# Patient Record
Sex: Female | Born: 1955 | Race: Black or African American | Hispanic: No | Marital: Married | Smoking: Former smoker
Health system: Southern US, Community
[De-identification: ages and names within clinical notes are randomized; demographics above are authoritative.]

## PROBLEM LIST (undated history)

## (undated) DIAGNOSIS — I10 Essential (primary) hypertension: Secondary | ICD-10-CM

---

## 1997-11-20 ENCOUNTER — Inpatient Hospital Stay (HOSPITAL_COMMUNITY): Admission: EM | Admit: 1997-11-20 | Discharge: 1997-11-23 | Payer: Self-pay | Admitting: Emergency Medicine

## 1997-11-25 ENCOUNTER — Inpatient Hospital Stay (HOSPITAL_COMMUNITY): Admission: EM | Admit: 1997-11-25 | Discharge: 1997-11-30 | Payer: Self-pay | Admitting: Emergency Medicine

## 1998-03-01 ENCOUNTER — Ambulatory Visit (HOSPITAL_COMMUNITY): Admission: RE | Admit: 1998-03-01 | Discharge: 1998-03-01 | Payer: Self-pay | Admitting: *Deleted

## 1998-09-23 ENCOUNTER — Inpatient Hospital Stay (HOSPITAL_COMMUNITY): Admission: AD | Admit: 1998-09-23 | Discharge: 1998-09-23 | Payer: Self-pay | Admitting: Obstetrics & Gynecology

## 1999-01-15 ENCOUNTER — Emergency Department (HOSPITAL_COMMUNITY): Admission: EM | Admit: 1999-01-15 | Discharge: 1999-01-15 | Payer: Self-pay | Admitting: *Deleted

## 1999-10-01 ENCOUNTER — Other Ambulatory Visit: Admission: RE | Admit: 1999-10-01 | Discharge: 1999-10-01 | Payer: Self-pay | Admitting: Obstetrics and Gynecology

## 1999-11-14 ENCOUNTER — Observation Stay (HOSPITAL_COMMUNITY): Admission: RE | Admit: 1999-11-14 | Discharge: 1999-11-15 | Payer: Self-pay | Admitting: Obstetrics and Gynecology

## 1999-11-16 ENCOUNTER — Inpatient Hospital Stay (HOSPITAL_COMMUNITY): Admission: AD | Admit: 1999-11-16 | Discharge: 1999-11-16 | Payer: Self-pay | Admitting: Obstetrics and Gynecology

## 1999-11-18 ENCOUNTER — Inpatient Hospital Stay (HOSPITAL_COMMUNITY): Admission: EM | Admit: 1999-11-18 | Discharge: 1999-11-18 | Payer: Self-pay | Admitting: Obstetrics and Gynecology

## 1999-11-21 ENCOUNTER — Inpatient Hospital Stay (HOSPITAL_COMMUNITY): Admission: AD | Admit: 1999-11-21 | Discharge: 1999-11-21 | Payer: Self-pay | Admitting: *Deleted

## 2000-04-25 ENCOUNTER — Inpatient Hospital Stay (HOSPITAL_COMMUNITY): Admission: EM | Admit: 2000-04-25 | Discharge: 2000-04-27 | Payer: Self-pay

## 2000-04-25 ENCOUNTER — Encounter: Payer: Self-pay | Admitting: Emergency Medicine

## 2002-04-15 ENCOUNTER — Encounter: Admission: RE | Admit: 2002-04-15 | Discharge: 2002-04-15 | Payer: Self-pay | Admitting: *Deleted

## 2002-08-16 ENCOUNTER — Encounter: Admission: RE | Admit: 2002-08-16 | Discharge: 2002-08-16 | Payer: Self-pay | Admitting: *Deleted

## 2002-08-16 ENCOUNTER — Encounter: Payer: Self-pay | Admitting: *Deleted

## 2003-02-17 ENCOUNTER — Ambulatory Visit (HOSPITAL_BASED_OUTPATIENT_CLINIC_OR_DEPARTMENT_OTHER): Admission: RE | Admit: 2003-02-17 | Discharge: 2003-02-17 | Payer: Self-pay | Admitting: Urology

## 2003-04-03 ENCOUNTER — Inpatient Hospital Stay (HOSPITAL_COMMUNITY): Admission: AD | Admit: 2003-04-03 | Discharge: 2003-04-03 | Payer: Self-pay | Admitting: Obstetrics

## 2003-04-11 ENCOUNTER — Encounter (INDEPENDENT_AMBULATORY_CARE_PROVIDER_SITE_OTHER): Payer: Self-pay

## 2003-04-12 ENCOUNTER — Ambulatory Visit (HOSPITAL_COMMUNITY): Admission: RE | Admit: 2003-04-12 | Discharge: 2003-04-12 | Payer: Self-pay | Admitting: Obstetrics

## 2003-08-10 ENCOUNTER — Ambulatory Visit (HOSPITAL_COMMUNITY): Admission: RE | Admit: 2003-08-10 | Discharge: 2003-08-10 | Payer: Self-pay | Admitting: Cardiology

## 2003-11-14 ENCOUNTER — Encounter: Admission: RE | Admit: 2003-11-14 | Discharge: 2003-11-14 | Payer: Self-pay | Admitting: Cardiology

## 2003-11-16 ENCOUNTER — Encounter (HOSPITAL_COMMUNITY): Admission: RE | Admit: 2003-11-16 | Discharge: 2003-11-30 | Payer: Self-pay | Admitting: Cardiology

## 2003-12-28 ENCOUNTER — Encounter: Admission: RE | Admit: 2003-12-28 | Discharge: 2003-12-28 | Payer: Self-pay | Admitting: Cardiology

## 2004-01-01 ENCOUNTER — Emergency Department (HOSPITAL_COMMUNITY): Admission: EM | Admit: 2004-01-01 | Discharge: 2004-01-02 | Payer: Self-pay | Admitting: Emergency Medicine

## 2004-01-11 ENCOUNTER — Encounter: Admission: RE | Admit: 2004-01-11 | Discharge: 2004-01-11 | Payer: Self-pay | Admitting: *Deleted

## 2004-01-21 ENCOUNTER — Encounter: Admission: RE | Admit: 2004-01-21 | Discharge: 2004-01-21 | Payer: Self-pay | Admitting: Cardiology

## 2004-02-17 ENCOUNTER — Inpatient Hospital Stay (HOSPITAL_COMMUNITY): Admission: EM | Admit: 2004-02-17 | Discharge: 2004-02-17 | Payer: Self-pay | Admitting: Emergency Medicine

## 2004-03-08 ENCOUNTER — Encounter: Admission: RE | Admit: 2004-03-08 | Discharge: 2004-03-08 | Payer: Self-pay | Admitting: Cardiology

## 2004-12-24 ENCOUNTER — Emergency Department (HOSPITAL_COMMUNITY): Admission: EM | Admit: 2004-12-24 | Discharge: 2004-12-24 | Payer: Self-pay | Admitting: Emergency Medicine

## 2005-01-21 ENCOUNTER — Ambulatory Visit (HOSPITAL_BASED_OUTPATIENT_CLINIC_OR_DEPARTMENT_OTHER): Admission: RE | Admit: 2005-01-21 | Discharge: 2005-01-21 | Payer: Self-pay | Admitting: Cardiology

## 2005-01-22 ENCOUNTER — Ambulatory Visit (HOSPITAL_COMMUNITY): Admission: RE | Admit: 2005-01-22 | Discharge: 2005-01-22 | Payer: Self-pay | Admitting: Obstetrics

## 2005-01-22 ENCOUNTER — Encounter (INDEPENDENT_AMBULATORY_CARE_PROVIDER_SITE_OTHER): Payer: Self-pay | Admitting: *Deleted

## 2005-01-26 ENCOUNTER — Ambulatory Visit: Payer: Self-pay | Admitting: Internal Medicine

## 2005-01-28 ENCOUNTER — Encounter: Admission: RE | Admit: 2005-01-28 | Discharge: 2005-01-28 | Payer: Self-pay | Admitting: Cardiology

## 2005-04-06 ENCOUNTER — Emergency Department (HOSPITAL_COMMUNITY): Admission: EM | Admit: 2005-04-06 | Discharge: 2005-04-06 | Payer: Self-pay | Admitting: Emergency Medicine

## 2005-05-02 ENCOUNTER — Inpatient Hospital Stay (HOSPITAL_COMMUNITY): Admission: EM | Admit: 2005-05-02 | Discharge: 2005-05-06 | Payer: Self-pay | Admitting: Emergency Medicine

## 2005-06-17 ENCOUNTER — Ambulatory Visit (HOSPITAL_COMMUNITY): Admission: RE | Admit: 2005-06-17 | Discharge: 2005-06-17 | Payer: Self-pay | Admitting: Cardiology

## 2005-12-21 ENCOUNTER — Emergency Department (HOSPITAL_COMMUNITY): Admission: EM | Admit: 2005-12-21 | Discharge: 2005-12-22 | Payer: Self-pay | Admitting: Emergency Medicine

## 2006-03-30 ENCOUNTER — Encounter: Admission: RE | Admit: 2006-03-30 | Discharge: 2006-03-30 | Payer: Self-pay | Admitting: Cardiology

## 2006-12-17 ENCOUNTER — Encounter: Admission: RE | Admit: 2006-12-17 | Discharge: 2006-12-17 | Payer: Self-pay | Admitting: Urology

## 2006-12-18 ENCOUNTER — Ambulatory Visit (HOSPITAL_BASED_OUTPATIENT_CLINIC_OR_DEPARTMENT_OTHER): Admission: RE | Admit: 2006-12-18 | Discharge: 2006-12-18 | Payer: Self-pay | Admitting: Urology

## 2006-12-18 ENCOUNTER — Emergency Department (HOSPITAL_COMMUNITY): Admission: EM | Admit: 2006-12-18 | Discharge: 2006-12-19 | Payer: Self-pay | Admitting: Emergency Medicine

## 2007-02-25 ENCOUNTER — Ambulatory Visit (HOSPITAL_COMMUNITY): Admission: RE | Admit: 2007-02-25 | Discharge: 2007-02-25 | Payer: Self-pay | Admitting: Gastroenterology

## 2007-04-05 ENCOUNTER — Ambulatory Visit (HOSPITAL_COMMUNITY): Admission: RE | Admit: 2007-04-05 | Discharge: 2007-04-05 | Payer: Self-pay | Admitting: Otolaryngology

## 2007-04-20 IMAGING — CT CT HEAD W/O CM
1 series · 16 of 30 positions shown, 20 images · IV contrast (agent unspecified)
Comparison: 03/08/2004 and 02/17/2004.

CLINICAL DATA: 49-year-old female, severe headache.   
HEAD CT WITHOUT CONTRAST:
TECHNIQUE: Contiguous axial images were obtained from the base of the skull through the vertex according to standard protocol without contrast.

[Series 2: brain w/o 4.8 h40s st · axial · non-contrast · 0.43mm/px · z∈[-141,-11]mm · 16 of 30 slices shown, 20 images]
[im 2/30  brain]
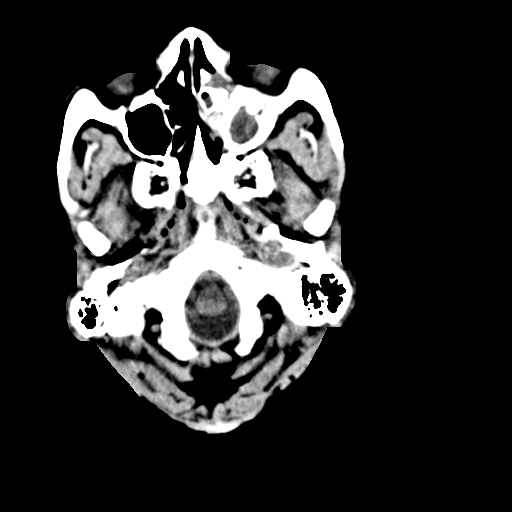
[im 2/30  bone]
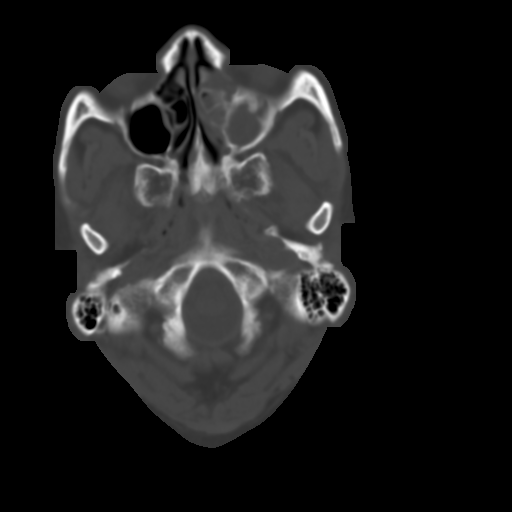
[im 4/30  brain]
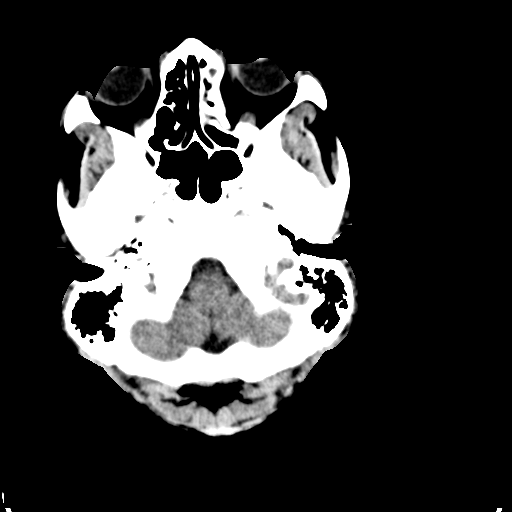
[im 6/30  brain]
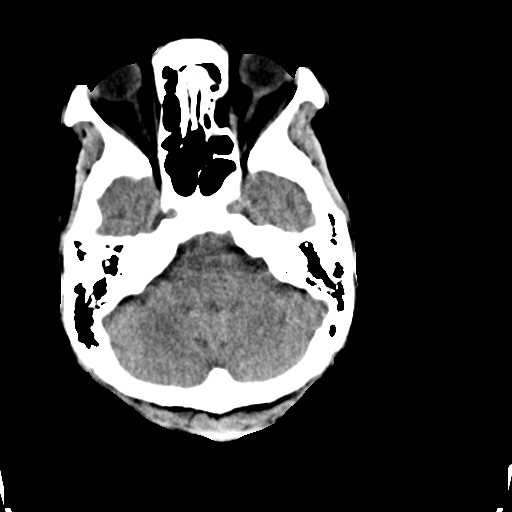
[im 8/30  brain]
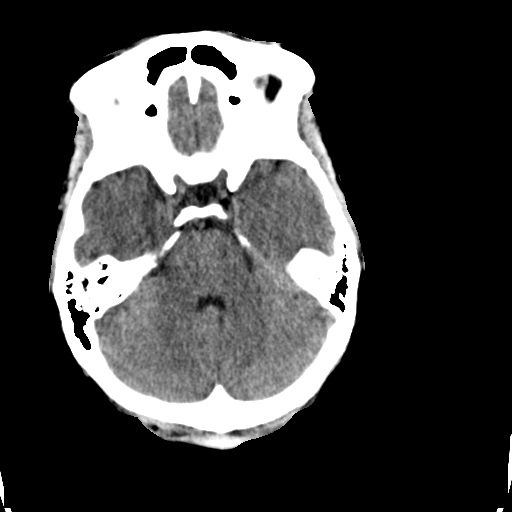
[im 9/30  brain]
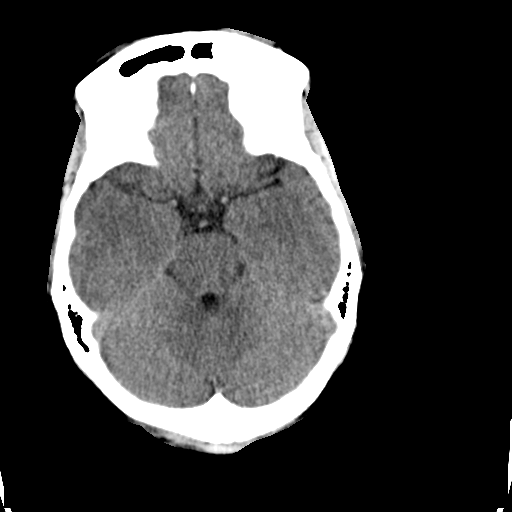
[im 9/30  bone]
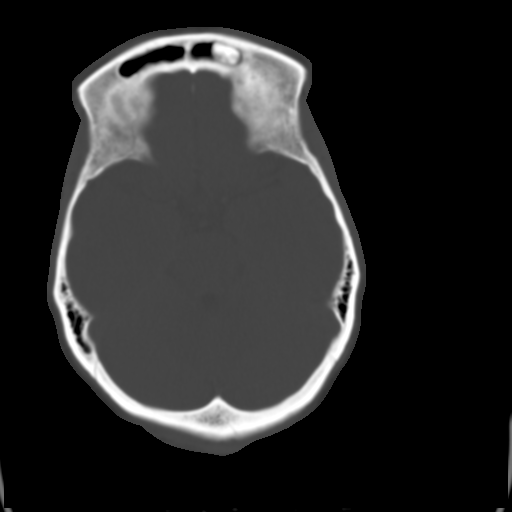
[im 11/30  brain]
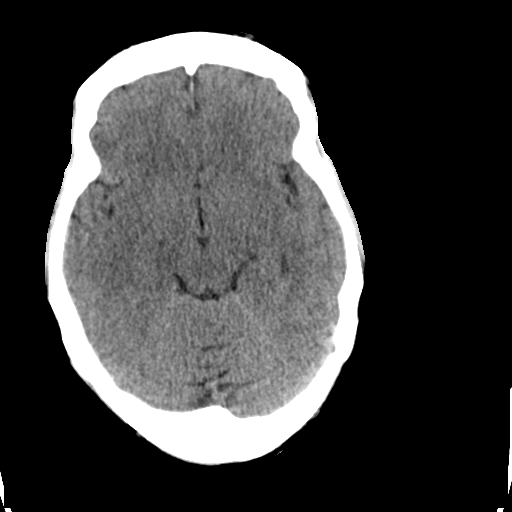
[im 13/30  brain]
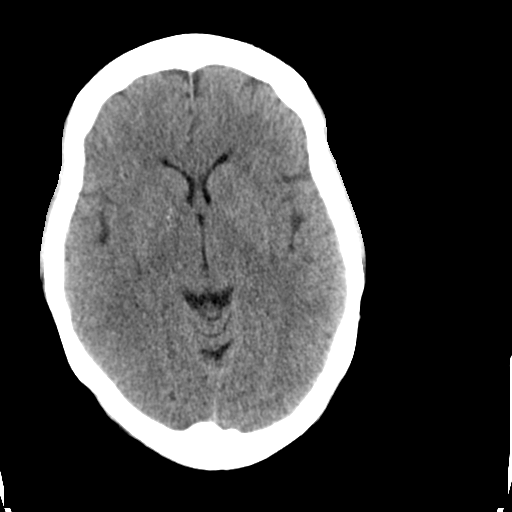
[im 15/30  brain]
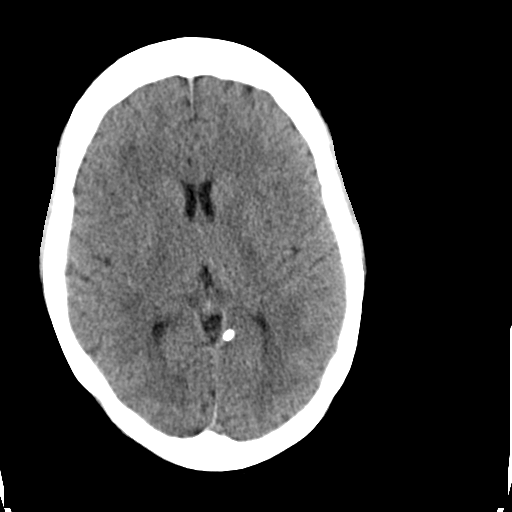
[im 16/30  brain]
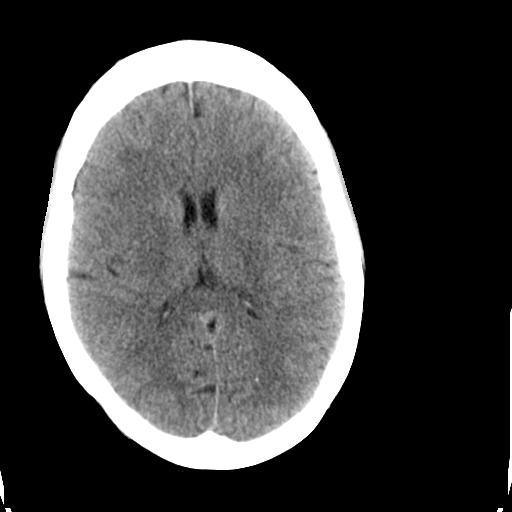
[im 16/30  bone]
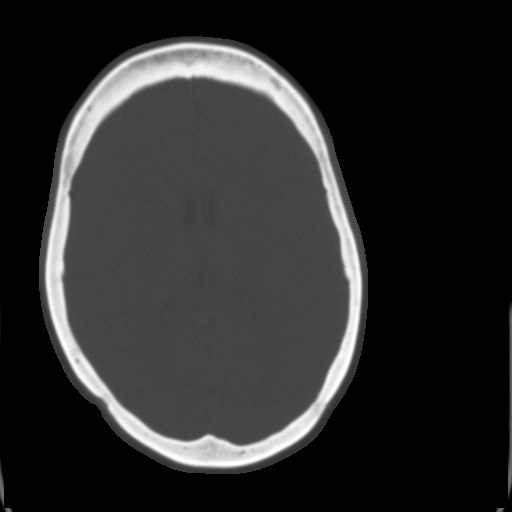
[im 18/30  brain]
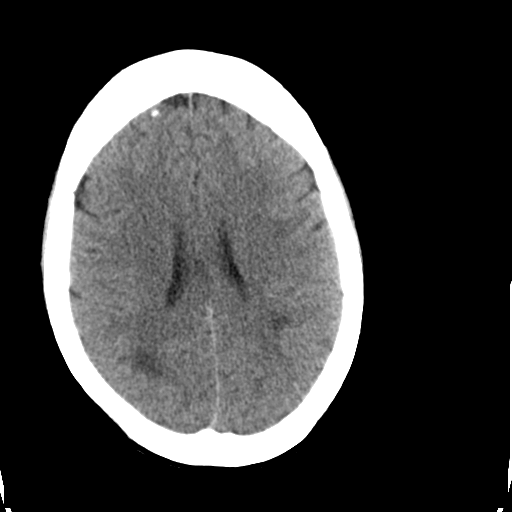
[im 20/30  brain]
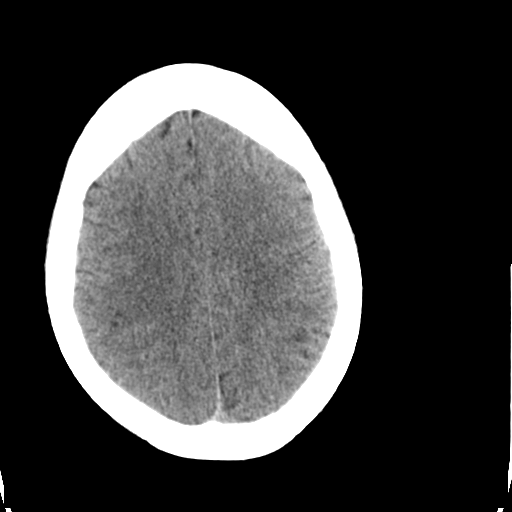
[im 22/30  brain]
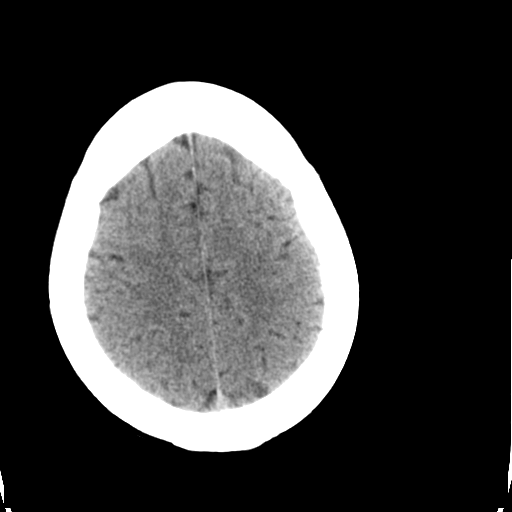
[im 23/30  brain]
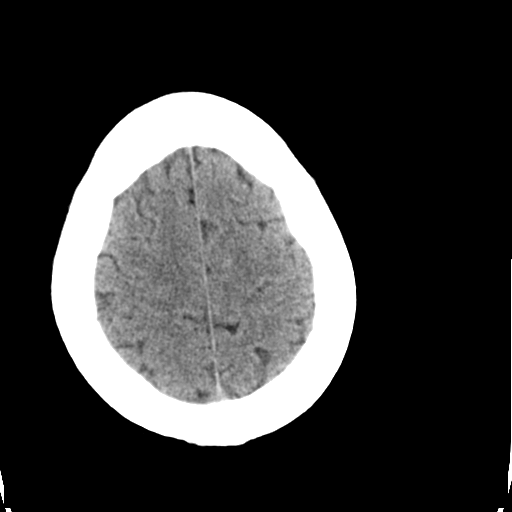
[im 23/30  bone]
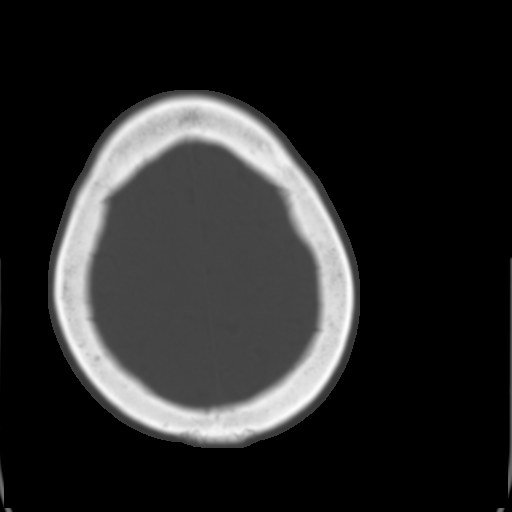
[im 25/30  brain]
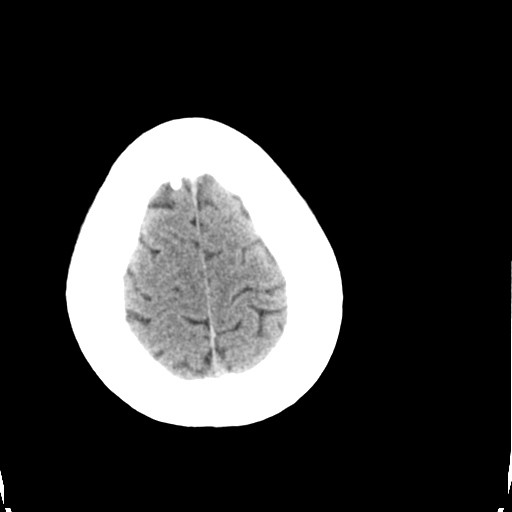
[im 27/30  brain]
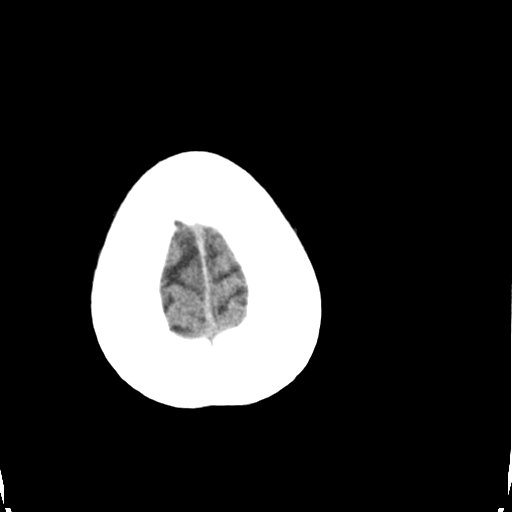
[im 29/30  brain]
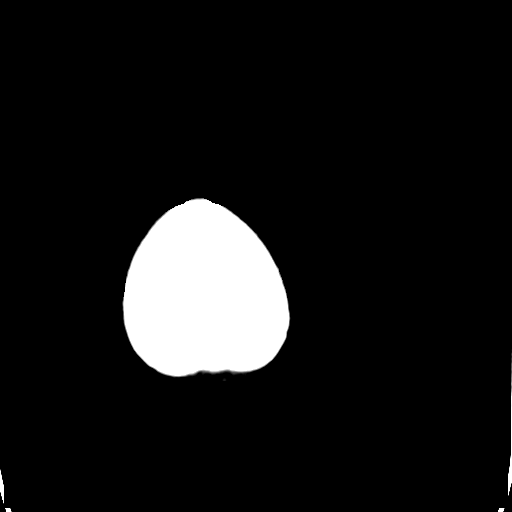

[16 of 30 positions shown; findings below may reference images not displayed]

FINDINGS: Patchy subcortical hypoattenuation is present in the parietooccipital regions as before.   These changes are also evident in the frontal lobes to a lesser degree.  No hydrocephalus, midline shift, acute edema, hemorrhage, herniation, hydrocephalus.  No extra-axial fluid collection.  Ventricles are midline and symmetric.  Cisterns are patent.  Mastoids are clear.  Chronic sinus disease is present in the left maxillary sinus.
IMPRESSION: 1.  Chronic microvascular ischemic change.  No acute interval change.  
2.   Chronic expansile left maxillary sinus disease.

## 2008-01-03 ENCOUNTER — Encounter: Admission: RE | Admit: 2008-01-03 | Discharge: 2008-01-03 | Payer: Self-pay | Admitting: Cardiology

## 2008-03-22 ENCOUNTER — Encounter: Payer: Self-pay | Admitting: Emergency Medicine

## 2008-03-22 ENCOUNTER — Inpatient Hospital Stay (HOSPITAL_COMMUNITY): Admission: AD | Admit: 2008-03-22 | Discharge: 2008-03-27 | Payer: Self-pay | Admitting: Cardiology

## 2008-03-23 ENCOUNTER — Ambulatory Visit: Payer: Self-pay | Admitting: Surgery

## 2008-03-23 ENCOUNTER — Encounter (INDEPENDENT_AMBULATORY_CARE_PROVIDER_SITE_OTHER): Payer: Self-pay | Admitting: Cardiology

## 2008-06-19 ENCOUNTER — Ambulatory Visit (HOSPITAL_COMMUNITY): Admission: RE | Admit: 2008-06-19 | Discharge: 2008-06-19 | Payer: Self-pay | Admitting: Gastroenterology

## 2009-05-28 ENCOUNTER — Encounter: Admission: RE | Admit: 2009-05-28 | Discharge: 2009-05-28 | Payer: Self-pay | Admitting: Cardiology

## 2010-03-14 ENCOUNTER — Encounter: Admission: RE | Admit: 2010-03-14 | Discharge: 2010-03-14 | Payer: Self-pay | Admitting: Cardiology

## 2010-08-03 ENCOUNTER — Encounter: Payer: Self-pay | Admitting: Cardiology

## 2010-08-04 ENCOUNTER — Encounter: Payer: Self-pay | Admitting: Cardiology

## 2010-08-05 ENCOUNTER — Encounter: Payer: Self-pay | Admitting: Cardiology
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
## 2010-11-26 NOTE — Op Note (Signed)
[name], [name]                ACCOUNT NO.:  [id number]   MEDICAL RECORD NO.:  [id number]          PATIENT TYPE:  AMB   LOCATION:  NESC                         FACILITY:  [facility]   PHYSICIAN:  Lindaann Slough, M.D.  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   PROCEDURE:  Cystoscopy, HOD and meatal dilation.   SURGEON:  Danae Chen, M.D.   ANESTHESIA:  General.   INDICATIONS:  The patient is a 30 years of female who has been  complaining of frequency, hesitancy, decreased force of the urinary  stream, sensation of incomplete emptying of the bladder, and straining  on urination.  She had urethral dilations in the past and she does not  want to have the procedure done under local anesthesia and she is  scheduled today for cystoscopy, hydraulic bladder distension and meatal  dilation under general anesthesia.   DESCRIPTION OF PROCEDURE:  The patient was then prepped and draped and  placed in the dorsal lithotomy position.  A #22 panendoscope was  inserted in the bladder.  The bladder mucosa is normal.  There is no  stone or tumor in the bladder.  The ureteral orifices are  in normal  position and shape with clear efflux.  There is no evidence of  submucosal hemorrhage.  The bladder capacity is about 700 mL.  The  bladder was then distended for about 10 minutes.   The cystoscope was removed.  The urethra was then dilated with #30-  [place].   The patient tolerated the procedure well and left the OR in satisfactory  condition to post anesthesia care unit.      Lindaann Slough, M.D.  Electronically Signed     MN/MEDQ  D:  12/18/2006  T:  12/18/2006  Job:  [id number]

## 2010-11-26 NOTE — Op Note (Signed)
[name], WINKELS                ACCOUNT NO.:  [id number]   MEDICAL RECORD NO.:  [id number]          PATIENT TYPE:  AMB   LOCATION:  ENDO                         FACILITY:  [facility]   PHYSICIAN:  Shirley Friar, MDDATE OF BIRTH:  1955/11/18   DATE OF PROCEDURE:  02/25/2007  DATE OF DISCHARGE:                               OPERATIVE REPORT   COLONOSCOPY:   INDICATIONS:  Screening.   MEDICATIONS:  Per anesthesia, propofol 300 mg infusion, fentanyl 50 mcg  IV, Versed 2.5 mg IV.   FINDINGS:  Rectal exam was normal.  A pediatric Pentax colonoscope was  inserted into an adequately-prepped colon and advanced to the cecum,  where the ileocecal valve and appendiceal orifice were identified.  On  careful withdrawal of the colonoscope no mucosal abnormalities were  seen.  Retroflexion was unremarkable.   ASSESSMENT:  Normal colonoscopy.   PLAN:  Repeat colonoscopy in 10 years.      Shirley Friar, MD  Electronically Signed     VCS/MEDQ  D:  02/25/2007  T:  02/26/2007  Job:  [id number]   cc:   Osvaldo Shipper. Spruill, M.D.  Fax: [phone]

## 2010-11-29 NOTE — Op Note (Signed)
[facility]  Patient:    Bailey, [name]                         MRN: [id number] Proc. Date: 11/14/99 Adm. Date:  [id number] Disc. Date: [id number] Attending:  Maxie Better                           Operative Report  PREOPERATIVE DIAGNOSIS:       Desires sterilization, pelvic relaxation disorder.  POSTOPERATIVE DIAGNOSIS:      Desires sterilization, pelvic relaxation disorder, first degree rectocele.  OPERATION:                    Laparoscopic tubal ligation with Filshie clips, posterior colporrhaphy, perineorrhaphy.  SURGEON:                      Sheronette A. Cherly Hensen, M.D.  ASSISTANT:                    Silverio Lay, M.D. for colporrhaphy.  ANESTHESIA:                   General anesthesia.  ESTIMATED BLOOD LOSS:  INDICATIONS:                  This is a 55 year old, gravida 52, para 92, female who presents for permanent sterilization and repair of gaping introitus with subsequent loss of sensation with intercourse.  The patient was noted to have marked pelvic relaxation secondary to obstetrical deliveries.  She complains of pelvic pressure and loss of sensation with intercourse.  Examination had revealed a gaping introitus with first degree rectocele.  The sphincter muscle was intact.  The patient understands that a tubal ligation would result in its nonreversibility,  permanence, failure rate of 1 out of 300 to 1 out of 500, injury to surrounding  organ structures.  The gaping introitus repair may result in dyspareunia, rectovaginal fistula with the posterior repair and injury to the underlying structures.  After reviewing the risks and benefits of both planned procedures s outlined to the patient, consent was signed.  The patient was transferred to the operating room.  DESCRIPTION OF PROCEDURE:     Under adequate general anesthesia, the patient was placed in the dorsal lithotomy position using Allen stirrups.  The  abdomen, perineum, vagina, was sterilely prepped in the usual fashion.  Examination under anesthesia had revealed a bulky anteverted uterus, no adnexal masses. Rectovaginal examination had shown a first degree rectocele with gaping introitus.  No evidence of uterine prolapse was noted.  The patient was then sterilely draped.  Bivalve  speculum was placed in the vagina.  Single tooth tenaculum was placed on the anterior lip of her cervix.  Kahn cannula was introduced into the cervical os and attached to the tenaculum for manipulation of the uterus.  Attention was turned to the abdomen where a small infraumbilical incision was made through a previous scar. The Veress needle was introduced into the abdominal cavity without incident. Opening pressure of 6 was noted.  3.5 liters of CO2 was insufflated.  A 10 mm trocar with sleeve was introduced into the abdominal cavity without incident. he trocar was removed through that port.  A lighted video laparoscope through which a Filshie clip applicator was then placed.  The patient was placed in the Trendelenburg Panoramic view revealed a normal liver edge, uterus that  appeared  bulky, slightly regular, but otherwise unremarkable.  Both tubes and ovaries were noted and appeared to be normal.  The Filshie clip was then applied to the right fallopian tube about 2 cm away from the cornual region without incident.  This as performed in the same fashion on the contralateral side.  The tubes were subsequently inspected with good placement of the clips noted bilaterally. With this noted, the abdomen was deflated and the instrument removed.  The incision as then closed with a deep layer of figure-of-eight 0 Vicryl suture and the skin was injected with 0.25% Marcaine and approximated using 4-0 Vicryl suture. Attention was then turned to the vagina.  The instruments were removed from the vagina and the vagina was inspected again.  The anal  sphincter as had been noted prior had  been intact.  The pelvic examination showed a gaping introitus.  Loss of perineal body was noted.  The lateral aspects of the posterior forchette was grasped with Allis clamps and a V-shaped incision was made down to the area of the rectal sphincter.  This wedge of tissue was then trimmed away with the Metzenbaum scissors and sharp dissection.  The dissection in the midline of the posterior vagina was carried out by opening the posterior vagina in the midline for about a distance of 2.5 inches.  The posterior vaginal mucosa was then bluntly entered with sharp dissection, trimmed away from the underlying rectum.  Several bleeders in these  areas were clamped and ligated as well as cauterized.  The endopelvic fascia was then identified and approximated in the midline using 0 Vicryl interrupted suture. The excessive posterior vaginal mucosa was then trimmed bilaterally.  The posterior vagina was closed with interrupted 2-0 chromic sutures.  The peritoneal body was then built up with a plicating stitch of 0 Vicryl suture and the repair was then completed in the usual fashion as a routine episiotomy repair would be otherwise performed.  The vagina was inspected.  Good hemostasis was noted.  It was packed with plain gauze soaked in estrogen cream.  Estimated blood loss was 250 cc. Specimen was the posterior vaginal mucosa which was not sent.  Complications were none.  An indwelling Foley catheter was placed due to the vaginal packing.  The  patient was transferred to the recovery room in stable condition. Intraoperative fluid was 1100 cc Crystalloid.  Urine output was about 600 cc of clear yellow urine.  The patient was remained overnight for pain management. DD:  11/15/99 TD:  11/16/99 Job: [id number]

## 2010-11-29 NOTE — Discharge Summary (Signed)
[facility]  Patient:    [name], [name]                         MRN: [id number] Adm. Date:  [id number] Disc. Date: [id number] Attending:  Tresa Garter Dictator:   Cornell Barman, P.A. CC:         Angelena Sole, M.D. [facility]  Kelli Hope, M.D.  Nathen May, M.D., [facility]   Discharge Summary  DISCHARGE DIAGNOSES: 1. Right facial nerve palsy. 2. Subjective diplopia. 3. Pain syndrome. 4. Headache. 5. Chest pressure.  BRIEF HISTORY:  Ms. [name] is a 55 year old female who presented with chest pain and headache. She also had associated right facial droop, right ear pain and dizziness.  PAST MEDICAL HISTORY: 1. Nonobstructive hypertrophic cardiomyopathy. 2. History of nonsustained V. tach. 3. Chronic pain. 4. Hypertension.  LABS ON ADMISSION:  Arterial blood gas on room air, pH was 7.5, pCO2 27.4, PO2 was 94. EKG revealed normal sinus rhythm with nonspecific ST changes. Chest x-ray showed cardiomegaly.  HOSPITAL COURSE BY PROBLEM: #1 - CHEST PRESSURE LIKELY NON-CARDIAC. Cardiac enzymes are negative x 3. We did obtain and echo during this hospitalization. Results are still pending. We were able to review Dr. Koren Bound office note. She was last seen in his office March of 2001. The patient last had a nitrate study in August of 1999. This was negative for any outflow obstruction. There was no SAM induced at that time. The patient was noted to have a hyperdynamic LV wall motion. The patient also has a history of a negative CATH several years ago. The patient in March of 2000 was on Verapamil and Lopressor. When she was admitted at this time, it sounds as if she had not had her prescriptions refilled and we did resume her on these medications. #2 - NEURO. The patient had a new right Bells palsy. Head CT was negative. The patient had a significant amount of pain and because of this, we did ask for neurology to see the patient. Dr. Thad Ranger  saw the patient on April 28, 1999 and did agree that she had a right facial nerve palsy with subjective diplopia. He did state that he had seen pain with this syndrome. He did make recommendations including increasing his Neurontin to 300 mg 3 times a day and adding a tricyclic. He also recommended judicious narcotic use. #3 POSITIVE URINE DRUG SCREEN.  The patients urine drug screen was positive for opiates, tricyclics and amphetamines. The patient had only listed Lopressor as her medication prior to this admission. We did advise the patient at discharge she should only take the medications she had been prescribed.  DISCHARGE MEDICATIONS: 1. Protonix 40 mg q.d. 2. Lopressor 50 mg b.i.d. 3. Prednisone 20 mg b.i.d. for 5 days and then q.d. for 5 days. 4. Elavil 25 mg 1-2 tabs at bedtime. 5. Neurontin 300 mg 3 times a day. 6. Verapamil 80 mg 3 times a day. 7. Vicodin 1 tab every 4-6h as needed.  She has got an appointment to follow-up with Dr. Milinda Antis at the [facility] office Wednesday, October 24 at 8:30 a.m. She should also be scheduled to follow-up with Dr. Graciela Husbands in the next 1-2 weeks as well. DD:  04/27/00 TD:  04/27/00 Job: [id number]
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
## 2010-11-29 NOTE — Op Note (Signed)
NAMEBAYLI, [name] NO.:  [id number]   MEDICAL RECORD NO.:  [id number]          PATIENT TYPE:  AMB   LOCATION:  SDC                           FACILITY:  [facility]   PHYSICIAN:  Kathreen Cosier, M.D.DATE OF BIRTH:  09/25/1955   DATE OF PROCEDURE:  01/22/2005  DATE OF DISCHARGE:                                 OPERATIVE REPORT   PREOPERATIVE DIAGNOSIS:  Dysfunctional uterine bleeding.   SURGEON:  Kathreen Cosier, M.D.   PROCEDURE:  Diagnostic hysteroscopy, dilatation and curettage, and NovaSure  ablation.   Under general anesthesia, patient in the lithotomy position, the perineum  and vagina were prepped and draped, bladder emptied with a straight  catheter.  Bimanual exam revealed the uterus at top normal size.  In the  vagina, a speculum placed and the anterior lip of the cervix grasped with a  tenaculum.  The cervix was injected with 8 mL of 1% Xylocaine at 3, 9 and 12  o'clock, the endocervix curetted and a small amount of tissue obtained.  The  endometrial cavity sounded to 12 cm.  The cervical length was established to  be 5.5 cm, the cavity length 6.5 cm.  The cervix was dilated to a #25 Pratt  dilator, the diagnostic hysteroscope inserted.  The cavity was normal.  A  sharp curettage was performed, a small amount of tissue obtained, and then  the NovaSure device was inserted and the cavity integrity established, the  cavity width of 3.8 cm.  The ablation was performed for 1 minute at 136  watts.  Post ablation, hysteroscopy was performed and the cavity was totally  ablated.  Fluid deficit 40 mL.  The patient tolerated the procedure well and  taken to the recovery room in good condition.       BAM/MEDQ  D:  01/22/2005  T:  01/22/2005  Job:  [id number]

## 2010-11-29 NOTE — H&P (Signed)
[facility]  Patient:    [name], [name]                         MRN: [id number] Attending:  Sonda Primes, M.D. [facility] CC:         Angelena Sole, M.D. [facility]  Nathen May, M.D. [facility]   History and Physical  DATE OF BIRTH:  25-Mar-1956.  CHIEF COMPLAINT:  Chest pain, headaches.  HISTORY OF PRESENT ILLNESS:  The patient is a 55 year old black female who woke up today with drooping of the right side of her face with tingling over the right cheek and tingling in the right arm.  She also had some right ear pain and felt dizzy.  She is complaining of chest pain and a headache of apparently several days duration.  She is a poor historian.  She states she ran out of her Lopressor (unknown dose) several days ago and was feeling poorly.  She states that she had similar chest pain in the past.  PAST MEDICAL HISTORY: 1. Hypertension. 2. Tachycardic arrhythmia. 3. History of headaches. 4. She states that Dr. Graciela Husbands diagnosed her with cardiomyopathy in the past.    [facility] record shows that she was sent for chest pain on    January 15, 1999 and left AMA.  CURRENT MEDICATIONS:  Verapamil of unknown dose.  Lopressor of unknown dose. Aspirin.  Neurontin of unknown dose.  ALLERGIES:  CODEINE.  FAMILY HISTORY:  Brother died at age 69 with a cardiac problem.  Otherwise, no cardiac history of neurologic history in the family.  SOCIAL HISTORY:  She is married.  She denies drinking alcohol, smoking or using drugs.  She is staying home.  REVIEW OF SYSTEMS:  Chest pain of 2-3 weeks duration.  Headaches of 2-3 weeks duration.  No previous neurologic problems.  No chills or fever.  The rest is negative.  PHYSICAL EXAMINATION:  VITAL SIGNS:  Blood pressure 177/103, respirations 20, pulse 70, temperature 97.7.  GENERAL:  She is in mild acute distress.  She is moaning, complaining of chest pain.  HEENT:  Slightly dry.  Eyes without  icterus.  NECK:  Supple.  No rigidity.  LUNGS:  Clear to auscultation and percussion.  HEART:  S1 and S2.  No gallop.  ABDOMEN:  Soft and nontender.  No organomegaly.  No masses.  EXTREMITIES:  Lower extremities without edema.  NEUROLOGIC:  Cranial nerves II-XII normal.  ______.  Muscle strength normal. She is alert, oriented and cooperative.  She has a mild right-sided facial droop.  Pupils symmetric and reactive to light.  LABORATORY DATA:  ABG on room air with pH 7.509, PCO2 27.4, PO2 94.6,  EKG with normal sinus rhythm.  Nonspecific ST changes.  Unchanged from 2000.  Chest x-ray pending.  CK 97.  ASSESSMENT AND PLAN: 1. Chest pain of unclear etiology.  Admit to rule out myocardial infarction.    Troponin and CPK q.8h. x 3.  EKG in the morning.  Given aspirin. 2. Right facial droop, likely due to Bells palsy.  Obtain CT without    contrast. 3. Hypertension, uncontrolled.  Will give her Lopressor and Verapamil now.    Add Accupril.  May need to add a diuretic. 4. Headache.  Will use p.r.n. Vicodin.  CT scan ordered. DD:  04/25/00 TD:  04/25/00 Job: [id number]

## 2010-11-29 NOTE — Cardiovascular Report (Signed)
[name], [name]                ACCOUNT NO.:  [id number]   MEDICAL RECORD NO.:  [id number]          PATIENT TYPE:  OUT   LOCATION:  CATH                         FACILITY:  [facility]   PHYSICIAN:  Ricki Rodriguez, M.D.  DATE OF BIRTH:  09-01-1955   DATE OF PROCEDURE:  05/05/2005  DATE OF DISCHARGE:                              CARDIAC CATHETERIZATION   REFERRING PHYSICIAN:  Dr. Rinaldo Cloud   PROCEDURE:  1.  Left heart catheterization.  2.  Selective coronary angiography.  3.  Left ventricular function study.   INDICATIONS:  This 55 year old black female with hypertension had recurrent  chest pain and abnormal EKG.   APPROACH:  Right femoral artery using 4-French sheath and catheters.   COMPLICATIONS:  None.   HEMODYNAMIC DATA:  The left ventricular pressure was 154/13 and aortic  pressure was 154/84.   CORONARY ANATOMY:  The left main coronary artery was unremarkable.   Left anterior descending coronary artery showed ostial 20% narrowing and  slow filling of the rest of the vessel.  The diagonal vessels were  unremarkable.   Left circumflex coronary artery:  The left circumflex coronary artery was  unremarkable.  Its obtuse marginal branch 1 and 4 were small vessels and 2  and 3 were larger vessels.   Right coronary artery:  The right coronary artery was dominant and  unremarkable.  Its posterolateral branch and posterior descending coronary  artery were also unremarkable.   LEFT VENTRICULOGRAM:  The left ventriculogram showed hyperdynamic wall  motion along with catheter-induced ventricular tachycardia.  The ejection  fraction is estimated at 70%.   IMPRESSION:  1.  Minimal coronary artery disease.  2.  Hypertensive heart disease.   RECOMMENDATIONS:  This patient will be treated medically with addition of  vasodilator and ACE inhibitor.      Ricki Rodriguez, M.D.  Electronically Signed     ASK/MEDQ  D:  05/05/2005  T:  05/05/2005  Job:  [id number]

## 2010-11-29 NOTE — Consult Note (Signed)
[name], [name] NO.:  [id number]   MEDICAL RECORD NO.:  [id number]                   PATIENT TYPE:  INP   LOCATION:  1823                                 FACILITY:  [facility]   PHYSICIAN:  Melvyn Novas, M.D.               DATE OF BIRTH:  04/22/1956   DATE OF CONSULTATION:  02/17/2004  DATE OF DISCHARGE:  02/17/2004                                   CONSULTATION   REASON FOR CONSULTATION:  Neurology consultation for a Code Stroke at 3:45  a.m. on February 17, 2004 at the [facility] ER.  This 55 year old African-  American female is a patient of Dr. Donia Guiles previously of Dr. Sherryl Manges at [facility] reportedly has history of a cardiomyopathy,  hypertension, and has previous spells that were evaluated here in the ER for  right facial numbness, right arm numbness, etc.  The patient tonight went to  [employer] and got upset with a lady at the cash register after finding an  item mischarged on her bill around 1:30 a.m.  She developed anxiety,  shortness of breath, palpitations, her chest felt tight and she called her  son on the cell phone very emotionally imbalanced and he said that he had a  hard time understanding her calling this an onset of slurred speech.  It is  unclear to me if the 1:30 phone call is the time of onset of her symptoms or  just the time she chose to share her emotional upheaval with her son.  She  also called Dr. Magda Kiel answering service from the cell phone and was told  that she should come to the [facility] ER.  She drove here by private car  and was arriving described as right-sided weak with expressive aphasia and  not able to communicate with the ER staff and not able to contribute to the  obtainment of her medical history.   PHYSICAL EXAMINATION:  VITAL SIGNS:  Vital signs now 148/80 blood pressure,  heart rate is 82 and regular, temperature is 97.8.  The patient is  hypokalemic by Chem-7.  She is also  showing signs of hyperventilation with  CO2 depletion but is in no acute distress as I examine her.  LUNGS:  Clear to auscultation.  CARDIOVASCULAR:  Regular rate and rhythm, no murmur, no clubbing, no  cyanosis.  NEUROLOGICAL:  The patient states that the right side of body feels like no  blood is flowing through it.  She is now variable able to communicate.  She  uses the upper extremities bilaterally to gesture and to arrange the bed  sheets and shows no sign of focal weakness.  The patient acknowledged that  she did not take her aspirin and Verelan, 2 medications she can recall.  She  cannot recall her others.  Her mental status:  She is alert and oriented x3.  She is  an extremely poor historian and gives very tangential answers to very  differentiated questions.  Cranial nerve exam:  Pupils are equal to light  and accommodation.  Extraocular movements:  They are intact.  There is no  gaze preference, no papilledema, no visual field.  Asymmetry:  Tongue and  uvula midline.  The neck is supple.  She has no carotid bruit.  Motor exam:  She can extend both of her extremities for over 10 seconds against gravity  and perform a finger-nose test without dysmetria.  There is no pronator  drift for the upper extremities.  She cannot extend her right leg and  elevate it from the mattress for longer than 10 seconds.  Shows a drift,  corrects herself, lifts the leg up again, shows another drift, corrects  herself and keeps the leg up again.  This raises the question of a  functional problem.  Sensory:  She states that her right arm and leg feel  cold, not numb, not in pain.  The patient is unable to differentiate between  numbness and weakness and used the terms interchangeably.  Gait testing:  The patient can sit up unassisted in her bed and shows no truncal weakness  or ataxia.  She can stand up at the bedside.  Shows again no weakness,  truncal ataxia.  She could even get on her tiptoes  bilaterally.  She can  close her eyes and shows no positive Romberg.   ASSESSMENT:  If there is any organic correlation to this, it would be in the  form of a transient ischemic attack, left hemispheric, with transient right-  sided sensory loss.  More likely, I believe, is that the patient had an  anxiety attack or some emotional breakdown and suggestive of previous  nonorganic reasons for right-sided weakness and numbness in her workups in  2001 and 2003.  She has also stated that she was noncompliant with her  medication.   PLAN:  The patient will have to take aspirin.  We will supplement potassium.  Dr. Magda Kiel office was called twice tonight.  Dr. Sharyn Lull is on call and  will evaluate the patient for her persistent complaint of palpitation and  difficulties with shortness of breath and chest tightness.  I have suggested  that we observe the patient overnight, obtain an MRI with MRA and if this is  negative by diffusion-weighted image, we will discharge the patient on her  current regimen.                                               Melvyn Novas, M.D.    CD/MEDQ  D:  02/17/2004  T:  02/18/2004  Job:  [id number]

## 2010-11-29 NOTE — Discharge Summary (Signed)
[facility]  Patient:    [name], [name]                         MRN: [id number] Adm. Date:  [id number] Disc. Date: [id number] Attending:  Ardeen Fillers                           Discharge Summary  ADMISSION DIAGNOSES:          1. Desires sterilization.                               2. Pelvic relaxation disorder.                               3. Chronic hypertension.                               4. Cardiomyopathy.                               5. Osteoarthritis.  DISCHARGE DIAGNOSES:          1. Desires sterilization.                               2. Pelvic relaxation disorder.                               3. First degree rectocele.                               4. Chronic hypertension.                               5. Cardiomyopathy.                               6. Osteoarthritis.  HISTORY OF PRESENT ILLNESS:   This is a 55 year old, gravida 32, para 4, married black female who is being admitted for surgical management of her pelvic relaxation disorder.  The patient also desired concommitant sterilization.  The patient has been complaining of decreased sensation with intercourse and increased pelvic pressure and back pain.  Her examination was notable for a gaping introitus with a small posterior wall bulge, uterus that was bulky, anteverted, and a first degree rectocele.  The risks and benefits of the procedure have been explained to the patient.  HOSPITAL COURSE:              The patient was admitted to the hospital on Nov 14, 1999.  She was taken to the operating room where she underwent the above described procedure.  The findings at the time of the surgery were a bulky anteverted uterus, adnexa without any masses, normal tubes and ovaries bilaterally, normal liver edge, gaping vaginal introitus about three fingerbreadths wide and a first degree rectocele without any evidence of a cystocele.  The patient was kept overnight or pain  management and observation.  On postoperative day #1,  she had some back pain which resolved with the pain medicine.  She had been given a PCA morphine drip.  The patient noted some pelvic pressure, however, vaginal packing had been in place. She had not had a bowel movement.  Her abdomen was soft and nondistended.  The incisions were without any erythema or induration.  The pad had scant red blood and the packing was discontinued.  Her perineum appeared well approximated.  CBC was obtained which showed a hemoglobin of 11.4, hematocrit of 32.9, and white count of 9.7.  Her preoperative white count was 5.6.  Her Foley catheter was discontinued and the patient was discharged home after she had spontaneously voided.  Her postoperative course was otherwise unremarkable.  DISCHARGE MEDICATIONS:        Previous medications on admission. Specifically;                               1. Elavil 25 mg every day.                               2. Verapamil 80 mg p.o. t.i.d.                               3. Prevacid 30 mg p.o. q.d.                               4. Neurontin 300 mg t.i.d.                               5. Lopressor 50 mg p.o. t.i.d.                                Also discharged home on:                               1. Dilaudid one to two tablets every four to six                                  hours p.r.n. pain #20.                               2. Pericolace one p.o. q.d.  DISCHARGE INSTRUCTIONS:       Nothing per vagina for six weeks.  Sitz baths three times per day.  No straining with bowel movement.  Call if temperature greater han or equal to 100.4, severe abdominal pain, nausea and vomiting, incisional pain,  redness, or drainage.  DISPOSITION:                  Home.  CONDITION ON DISCHARGE:       Stable.  FOLLOW-UP:                    In six weeks postoperative at [facility]. DD:  12/18/99 TD:  12/19/99 Job: [id number]

## 2010-11-29 NOTE — Discharge Summary (Signed)
NAMECLAIR, [name]                ACCOUNT NO.:  [id number]   MEDICAL RECORD NO.:  [id number]          PATIENT TYPE:  INP   LOCATION:  1430                         FACILITY:  [facility]   PHYSICIAN:  Mohan N. Sharyn [name], M.D. DATE OF BIRTH:  1955-11-24   DATE OF ADMISSION:  05/02/2005  DATE OF DISCHARGE:  05/06/2005                                 DISCHARGE SUMMARY   ADMISSION DIAGNOSES:  1.  Chest pain, rule out myocardial infarction.  2.  Uncontrolled hypertension.  3.  Left ventricular hypertrophy.  4.  History of transient ischemic attack in the past.   FINAL DIAGNOSES:  1.  Mild coronary artery disease status post left catheterization by Dr.      Orpah Cobb.  2.  Hypertension.  3.  Left ventricular hypertrophy.  4.  History of transient ischemic attack in the past.  5.  Gastroesophageal reflux disease.   DISCHARGE MEDICATIONS:  1.  Lopressor 25 mg one tablet p.o. b.i.d.  2.  Protonix 40 mg p.o. b.i.d.  3.  Lisinopril 5 mg p.o. daily.  4.  Ferrous sulfate 325 mg p.o. daily.  5.  Vicodin ES one tablet q.4-6h p.r.n. pain.   ACTIVITY:  As tolerated.   DIET:  Low salt, low cholesterol.   FOLLOW UP:  Follow with Dr. Shana Chute in 2 weeks, and Dr. Algie Coffer in 1 week.   CONDITION ON DISCHARGE:  Stable.   BRIEF HISTORY:  [name] is a 55 year old black female with past medical  history significant for hypertension, history of concentric left ventricular  hypertrophy, history of TIA, who came to the ER complaining of chest  heaviness and sharp pain off and on for the last few days with mild  shortness of breath. Denies any nausea, vomiting, diaphoresis. Occasional  chest pain with movement associated with numbness in the right arm. Denies  any palpitations, light headedness, or syncope. Denies exertional chest  pain. The patient had a left catheterization in 1999 which showed mild CAD  and she was noted to have concentric LVH.  The patient has a history of  sudden cardiac death in  the family.   PAST MEDICAL HISTORY:  As above.   PAST SURGICAL HISTORY:  Back surgery for a herniated disk in April, 2006.  Partial thyroidectomy many years ago. Left elbow fracture in 1986.   SOCIAL HISTORY:  She is married, has four children. No smoking or alcohol  abuse. She is a [employer].   FAMILY HISTORY:  Father is alive at 64, had cancer. Mother is diabetic. One  brother died of sudden cardiac death at the age of 5. One sister died of  breast CA.   MEDICATIONS:  At home she was on Verapamil, Lasix and Flexeril.   EXAMINATION:  She was alert and oriented x3, in no acute distress. Blood  pressure was 144/89, pulse was 65. Conjunctiva pink. Neck supple, no JVD, no  bruit. Lungs were clear to auscultation without rhonchi. Cardiovascular exam  S1, S2 normal. There is a soft systolic murmur. Abdomen soft, positive bowel  sounds, nontender. Extremities with no clubbing, cyanosis or  edema.   LABORATORY DATA:  EKG showed normal sinus rhythm, left atrial enlargement,  LVH with minor T wave changes in anterolateral leads. LVH with strain  pattern versus ischemia. Cardiac enzymes x3 sets: CPK was 77, MB 1.3. Second  CPK 62, MB 1.1. Troponin I was 0.02 and the last one 0.01. Third CPK was 61,  MB of 1.1. Cholesterol was 138, HDL 42, LDL 82. Hemoglobin A1C was 5.4.  Sodium was 138, potassium 3.5, glucose was 81, BUN 9, creatinine 0.8.  Electrolytes on October 24: Sodium 136, potassium 4.1, bicarb 25, glucose  was slightly elevated at 107, BUN 9, creatinine 0.9. Hemoglobin was 12.1,  hematocrit 35.1. Repeat hemoglobin on October 24 was 10.9, hematocrit 32.1,  white count of 4900 which has been stable. Stool for occult blood: negative.   BRIEF HOSPITAL COURSE:  The patient was admitted to a telemetry unit and  myocardial infarction was ruled out by serial enzymes and EKG. The patient  had recurrent episodes of chest pain during her hospital stay and  subsequently underwent left  cardiac catheterization by Dr. Algie Coffer as per  procedure report. The patient tolerated the procedure well and there were no  complications. The patient's groin was stable and there was evidence of  hematoma or bruit. The patient has been ambulating in the hallway without  any problems. The patient was discharged by Dr. Donia Guiles on May 06, 2005 in stable condition.           ______________________________  [name], M.D.     MNH/MEDQ  D:  05/21/2005  T:  05/21/2005  Job:  [id number]   cc:   Osvaldo Shipper. Spruill, M.D.  Fax: [phone]   Ricki Rodriguez, M.D.  Fax: [phone]

## 2010-11-29 NOTE — Op Note (Signed)
   NAMEJARITA, [name]                            ACCOUNT NO.:  [id number]   MEDICAL RECORD NO.:  [id number]                   PATIENT TYPE:  AMB   LOCATION:  [facility]                                 [facility]ACILITY[facility]   PHYSICIAN:  Lindaann Slough, M.D.               DATE OF BIRTH:  December 24, 1955   DATE OF PROCEDURE:  02/17/2003  DATE OF DISCHARGE:                                 OPERATIVE REPORT   PREOPERATIVE DIAGNOSIS:  Recurrent urinary tract infections, rule out  interstitial cystitis.   POSTOPERATIVE DIAGNOSIS:  Interstitial cystitis.   PROCEDURES DONE:  1. Cystoscopy.  2. Urethral dilation.  3. Hydraulic bladder distention.   SURGEON:  Lindaann Slough, M.D.   ANESTHESIA:  General.   INDICATION:  The patient is a 55 year old female, who has been complaining  of frequency, urgency, dysuria.  She was treated with antibiotics without  any improvement of her symptoms.  She is scheduled today for cystoscopy and  hydrodistention of the bladder.   DESCRIPTION OF PROCEDURE:  Under general anesthesia, the patient was prepped  and draped and placed in the dorsal lithotomy position.  A #22 Wappler  cystoscope was inserted in the bladder.  The bladder mucosa is reddened.  There is no stone or tumor in the bladder.  The ureteral orifices are in  normal position and shape with clear efflux.  The bladder was then distended  for 10 minutes and after distention of the bladder, there was evidence of  submucosal hemorrhage.  The bladder capacity is about 1000 mL.  The bladder  was then emptied and the cystoscope removed.  The urethra was then dilated  with #32 [place].  Then, 15 mL of 0.5% Marcaine plus 400 mg of Pyridium was  instilled in the bladder.   The patient tolerated the procedure well and left the OR in satisfactory  condition to postanesthesia care unit.                                               Lindaann Slough, M.D.    MN/MEDQ  D:  02/17/2003  T:  02/17/2003  Job:   [id number]   cc:   Kathreen Cosier, M.D.  [address]  [place]  [place] [address]  Fax: [phone]

## 2010-11-29 NOTE — Op Note (Signed)
   [name], [name]                            ACCOUNT NO.:  [id number]   MEDICAL RECORD NO.:  [id number]                   PATIENT TYPE:  AMB   LOCATION:  SDC                                  FACILITY:  [facility]   PHYSICIAN:  Kathreen Cosier, M.D.           DATE OF BIRTH:  Feb 23, 1956   DATE OF PROCEDURE:  04/12/2003  DATE OF DISCHARGE:                                 OPERATIVE REPORT   PREOPERATIVE DIAGNOSIS:  Dysfunctional uterine bleeding.   PROCEDURE:  Diagnostic dilatation and curettage.   DESCRIPTION OF PROCEDURE:  Using MAC, patient in lithotomy position, the  perineum and vagina were prepped and draped, bladder emptied with a straight  catheter.  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  A  weighted speculum placed in the vagina.  Xylocaine 1% 5 mL injected in the  cervix at 9 o'clock and 5 mL at 3 o'clock.  The endocervix was curetted.  The endometrial cavity was sounded to 10 cm.  The cavity was anterior.  Cervix dilated to a #23 Shawnie Pons and a sharp curettage, and a large amount of  tissue was obtained.  No myomas were felt with the curette in the uterine  cavity, and no polyps were obtained.  The patient tolerated the procedure  well, was taken to the recovery room in good condition.                                               Kathreen Cosier, M.D.    BAM/MEDQ  D:  04/12/2003  T:  04/12/2003  Job:  [id number]

## 2010-11-29 NOTE — Discharge Summary (Signed)
NAMECASSITY, [name]                ACCOUNT NO.:  [id number]   MEDICAL RECORD NO.:  [id number]          PATIENT TYPE:  INP   LOCATION:  4713                         FACILITY:  [facility]   PHYSICIAN:  Osvaldo Shipper. Spruill, M.D.DATE OF BIRTH:  1956/05/23   DATE OF ADMISSION:  03/22/2008  DATE OF DISCHARGE:  03/27/2008                               DISCHARGE SUMMARY   This is a 55 year old female who has a history of multiple sclerosis who  had a syncopal episode.  According to bystanders, EMS was called.  The  patient was brought to the emergency department, evaluated, was found to  have some tender areas of multiple levels on the spinal examination.  Good range of motion of all the extremities.  After evaluation, with  negative C-spine films, thoracic spine films, and normal lab tests, it  was the opinion that the patient should be admitted for pain control and  for additional evaluation of syncopal episode.  The patient was  transferred from the [facility] to the [facility]  for admission.   The initial history and physical and daily visits were completed by Dr.  Donia Guiles.   The patient was admitted to the medical service for evaluation of  syncopal episodes of questionable cause.  It is of note that as noted  above this patient has multiple sclerosis.  She also suffers from  anxiety with depression and muscle spasm.  Plans were made for Neurology  consultation.  This was answered on March 22, 2008.  The patient was  seen by Dr. Threasa Beards of Neurology.  It was his opinion that this was a 35-  year-old with loss of consciousness episodes and in the differential  included syncope versus seizure especially in light of her reported  history of multiple sclerosis and seizure.  Plans were made for an MRI  of the brain, physical therapy, occupational therapy, and pain control.   The patient received a transthoracic echocardiogram by Dr. Rinaldo Cloud.  The patient was  noted to have overall left ventricular  systolic function that was normal.  The ejection fraction was between 55  and 60%.  There was no left ventricular regional wall motion  abnormalities and the left ventricular wall thickness was mildly  increased.  The left ventricle and right ventricle size were all normal.  The mitral valve showed no significant valvular regurgitation.  The  pulmonic valve showed mild regurgitation.  The tricuspid valve also  showed mild regurgitation.  No other significant findings.  The patient  also underwent a carotid duplex study.  There was mild mixed plaque  throughout bilaterally with no significant right or left ICA stenosis  noted.   On March 24, 2008, the patient was feeling better.  The MRI was  reviewed by Neurology, and there was mild dural and leptomeningeal  enhancement which was nonspecific.  There were stable periventricular  and subcortical white matter disease noted compatible with multiple  sclerosis.   Plans were made for additional evaluation with lumbar puncture.  This  was done on March 25, 2008, with  an opening pressure of 12 cm of  water, only 1 mL of cloudy fluid after 3 LP punctures was obtained.   The LP study was limited, but was negative for crypto and gram-stain was  negative.  At this point, it was Dr. Renata Caprice opinion that the patient  could have an outpatient followup in 1 month with repeat MRI in 3-6  months.  Also plans were made for an EEG as an outpatient.   On March 27, 2008, it was the opinion of the attending physician and  the consulting physician that the patient had received maximum benefit  from this hospitalization and could be discharged home.   Medications at discharge include lisinopril 20 mg daily, Flexeril 10 mg  3 times a day, and metoprolol 25 mg twice a day.  The patient is to  notify the physician immediately if any changes, problems, or concerns.      Ivery Quale, P.A.      Osvaldo Shipper. Spruill, M.D.  Electronically Signed    HB/MEDQ  D:  05/12/2008  T:  05/13/2008  Job:  [id number]

## 2010-11-29 NOTE — Consult Note (Signed)
[facility]  Patient:    [name], [name]                         MRN: [id number] Proc. Date: 04/27/00 Adm. Date:  [id number] Disc. Date: [id number] Attending:  Tresa Garter                          Consultation Report  DATE OF BIRTH:  [birth year].  REQUESTING PHYSICIAN:  Dr. Titus Dubin. Hopper.  REASON FOR EVALUATION:  Right facial pain and drooping.  HISTORY OF PRESENT ILLNESS:  This is the initial ER consultation visit for this established patient of [facility].  She is a 55 year old black female with a history of hypertension who has previously been evaluated by Dr. Candy Sledge for both headaches, numbness in the right arm and some back and left leg pain, of which she has been tried on various medications including Neurontin and tricyclics.  She reports that she was in her usual state of health until Saturday morning, when she awoke and realized that the right side of her face did not feel right.  She said that it was a little numb and tingly and that when she looked in the mirror, that it was drooping; she also noticed that she was having a lot of trouble with her right eye.  The right eye felt itchy and dry and it was not closing well.  She had trouble chewing, with food pooling in the right side of her cheek.  She also was not sure that she was tasting right and that her tongue felt numb. She also reports a pain behind the right eye, which she describes as a sharp stabbing pain which has worsened during the hospitalization.  Upon presenting to the emergency room on Saturday, she also reported tingling in the right arm.  A workup for a potential cardiac source was undertaken, which has been negative.  REVIEW OF SYSTEMS:  She has a very long history of headaches on and off for which she has been on some medications in the past including Neurontin.  She says that these have been worse for the past two or three  weeks.  She also reports intermittent chest pressure over the past few weeks.  She has chronic back pain which flares up every now and again and has taken Feldene and Elavil for this in the past.  She denies any focal weakness in the leg.  She does report tingling in the arm and face as above.  She has not had any problem swallowing but chewing has been difficult.  She also reports some subjective diplopia which is worse when looking up and to the right.  PAST MEDICAL HISTORY:  Hypertension.  Tachyarrhythmias.  Headaches, as above. Question of dilated cardiomyopathy.  FAMILY MEDICAL HISTORY:  Her brother died at 10 of a "enlarged heart."  No history of neurologic disease.  SOCIAL HISTORY:  She is married.  She denied drinking alcohol, smoking or using drugs.  She does not work outside the home.  MEDICATIONS:  Verapamil, Lopressor, aspirin, Neurontin.  PHYSICAL EXAMINATION  VITAL SIGNS:  Afebrile, temperature 97.1.  Blood pressure 150/84.  Pulse 57. Respirations 18.  GENERAL:  She is alert and fully oriented.  She seems in some distress secondary to pain and is exhibiting some pain behaviors.  NECK:  Supple without bruits.  CHEST:  Clear to auscultation.  HEART:  Regular rate and rhythm without murmurs.  NEUROLOGIC:  She is alert and oriented.  Her speech is fluent and not dysarthric.  She follows commands without difficulty.  She is able to give a clear and concise history.  Cranial nerves:  Funduscopic exam is benign. Pupils are equal and briskly reactive.  Extraocular movements seem full, although she does report some diplopia with up-gaze and to the right; however, this is inconsistent and varies between vertical and horizontal diplopia. Visual fields are full to confrontation.  There is subjective diminished light touch over the right side of the face, the right ear and the right neck, down to the base of the neck.  Facial strength is diminished in a seventh  cranial nerve pattern on the right.  Palate moves well in the midline.  Tongue seems to deviate to the left.  Sensation:  Diminished light touch over the right hand; otherwise, intact throughout.  Motor:  Normal bulk and tone.  Normal strength in all tested extremity muscles, although there is slight give-way on the right; she does get better with encouragement.  Cerebellar:  No tremor. Finger-to-nose is performed well, although she does miss her nose with her eyes on both sides.  This seems non-physiologic.  Reflexes are 1+ and symmetric throughout.  Toes are downgoing.  Gait exam is deferred.  LABORATORY AND X-RAY FINDINGS:  CBC is normal.  BMET is normal.  CK and troponin have been negative.  Drug screen on admission revealed amphetamines, opiates and tricyclics.  IMPRESSION 1. 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 2. Subjective diplopia.  Although this raises concern of a brain stem process,    this is inconsistent and does not seem to be fit well with any brain stem    syndrome that would go along with Bells palsy. 3. Pain syndrome secondary to #1.  RECOMMENDATIONS 1. Increase Neurontin to 300 mg t.i.d. 2. Nighttime dose of a tricyclic such as Elavil or Pamelor 25 to 50 mg q.h.s. 3. If above measures fail for pain control, next line drug would be    carbamazepine, preferably in a long-acting form such as Carbatrol or    Tegretol XR. 4. P.o. narcotics would be acceptable if they are used judiciously. 5. I discussed with her the favorable prognosis and my impression that I    expect this to run its course in a few weeks. 6. Follow up with her primary care physician and with Dr. Noreene Filbert as needed. DD:  04/27/00 TD:  04/27/00 Job: [id number]

## 2011-03-15 ENCOUNTER — Emergency Department (HOSPITAL_COMMUNITY): Payer: Medicaid Other

## 2011-03-15 ENCOUNTER — Inpatient Hospital Stay (HOSPITAL_COMMUNITY)
Admission: EM | Admit: 2011-03-15 | Discharge: 2011-03-20 | DRG: 313 | Disposition: A | Discharge status: Home Health | Payer: Medicaid Other | Attending: Internal Medicine | Admitting: Internal Medicine

## 2011-03-15 DIAGNOSIS — Z886 Allergy status to analgesic agent status: Secondary | ICD-10-CM

## 2011-03-15 DIAGNOSIS — Z8673 Personal history of transient ischemic attack (TIA), and cerebral infarction without residual deficits: Secondary | ICD-10-CM

## 2011-03-15 DIAGNOSIS — R51 Headache: Secondary | ICD-10-CM | POA: Diagnosis present

## 2011-03-15 DIAGNOSIS — G35 Multiple sclerosis: Secondary | ICD-10-CM | POA: Diagnosis present

## 2011-03-15 DIAGNOSIS — R0789 Other chest pain: Principal | ICD-10-CM | POA: Diagnosis present

## 2011-03-15 DIAGNOSIS — F411 Generalized anxiety disorder: Secondary | ICD-10-CM | POA: Diagnosis present

## 2011-03-15 DIAGNOSIS — I10 Essential (primary) hypertension: Secondary | ICD-10-CM | POA: Diagnosis present

## 2011-03-15 DIAGNOSIS — G894 Chronic pain syndrome: Secondary | ICD-10-CM | POA: Diagnosis present

## 2011-03-15 DIAGNOSIS — F3289 Other specified depressive episodes: Secondary | ICD-10-CM | POA: Diagnosis present

## 2011-03-15 DIAGNOSIS — F329 Major depressive disorder, single episode, unspecified: Secondary | ICD-10-CM | POA: Diagnosis present

## 2011-03-15 LAB — CBC
HCT: 35.7 % — ABNORMAL LOW (ref 36.0–46.0)
MCH: 31.6 pg (ref 26.0–34.0)
MCHC: 35.3 g/dL (ref 30.0–36.0)
MCV: 89.5 fL (ref 78.0–100.0)
RDW: 12.3 % (ref 11.5–15.5)

## 2011-03-15 LAB — COMPREHENSIVE METABOLIC PANEL
Alkaline Phosphatase: 92 U/L (ref 39–117)
BUN: 20 mg/dL (ref 6–23)
CO2: 25 mEq/L (ref 19–32)
Calcium: 9 mg/dL (ref 8.4–10.5)
GFR calc Af Amer: >60 mL/min (ref >60)
GFR calc non Af Amer: >60 mL/min (ref >60)
Glucose, Bld: 77 mg/dL (ref 70–99)
Total Protein: 7.5 g/dL (ref 6.0–8.3)

## 2011-03-15 LAB — URINALYSIS, ROUTINE W REFLEX MICROSCOPIC
Bilirubin Urine: NEGATIVE
Hgb urine dipstick: NEGATIVE
Protein, ur: NEGATIVE mg/dL
Urobilinogen, UA: 0.2 mg/dL (ref 0.0–1.0)

## 2011-03-15 LAB — LIPASE, BLOOD: Lipase: 40 U/L (ref 11–59)

## 2011-03-15 LAB — DIFFERENTIAL
Eosinophils Relative: 2 % (ref 0–5)
Lymphocytes Relative: 35 % (ref 12–46)
Lymphs Abs: 1.9 10*3/uL (ref 0.7–4.0)
Monocytes Absolute: 0.5 10*3/uL (ref 0.1–1.0)
Monocytes Relative: 9 % (ref 3–12)

## 2011-03-15 LAB — CK TOTAL AND CKMB (NOT AT ARMC)
CK, MB: 3.8 ng/mL (ref 0.3–4.0)
Relative Index: 3.2 — ABNORMAL HIGH (ref 0.0–2.5)

## 2011-03-15 LAB — TROPONIN I: Troponin I: <0.3 ng/mL (ref <0.30)

## 2011-03-16 ENCOUNTER — Inpatient Hospital Stay (HOSPITAL_COMMUNITY): Payer: Medicaid Other

## 2011-03-16 LAB — BASIC METABOLIC PANEL
BUN: 16 mg/dL (ref 6–23)
GFR calc non Af Amer: >60 mL/min (ref >60)
Glucose, Bld: 110 mg/dL — ABNORMAL HIGH (ref 70–99)
Potassium: 3.6 mEq/L (ref 3.5–5.1)

## 2011-03-16 LAB — LIPID PANEL
HDL: 52 mg/dL (ref >39)
Triglycerides: 70 mg/dL (ref <150)

## 2011-03-16 LAB — SEDIMENTATION RATE: Sed Rate: 1 mm/hr (ref 0–22)

## 2011-03-16 LAB — CBC
HCT: 33.3 % — ABNORMAL LOW (ref 36.0–46.0)
Hemoglobin: 11.3 g/dL — ABNORMAL LOW (ref 12.0–15.0)
MCHC: 33.9 g/dL (ref 30.0–36.0)

## 2011-03-16 LAB — RAPID URINE DRUG SCREEN, HOSP PERFORMED
Amphetamines: NOT DETECTED
Barbiturates: NOT DETECTED
Benzodiazepines: NOT DETECTED
Tetrahydrocannabinol: NOT DETECTED

## 2011-03-16 LAB — CARDIAC PANEL(CRET KIN+CKTOT+MB+TROPI)
Relative Index: INVALID (ref 0.0–2.5)
Relative Index: INVALID (ref 0.0–2.5)
Troponin I: <0.3 ng/mL (ref <0.30)
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
## 2011-03-17 ENCOUNTER — Inpatient Hospital Stay (HOSPITAL_COMMUNITY): Payer: Medicaid Other

## 2011-03-17 LAB — CSF CELL COUNT WITH DIFFERENTIAL
RBC Count, CSF: 1 /mm3 — ABNORMAL HIGH
Tube #: 1

## 2011-03-18 DIAGNOSIS — F411 Generalized anxiety disorder: Secondary | ICD-10-CM

## 2011-03-19 LAB — IMMUNOFIXATION ELECTROPHORESIS
IgG (Immunoglobin G), Serum: 1880 mg/dL — ABNORMAL HIGH (ref 690–1700)
IgM, Serum: 26 mg/dL — ABNORMAL LOW (ref 52–322)
Total Protein ELP: 6.9 g/dL (ref 6.0–8.3)

## 2011-03-19 LAB — PROTEIN ELECTROPH W RFLX QUANT IMMUNOGLOBULINS
Albumin ELP: 57.2 % (ref 55.8–66.1)
Alpha-1-Globulin: 4 % (ref 2.9–4.9)
Alpha-2-Globulin: 8.4 % (ref 7.1–11.8)
Beta 2: 2.9 % — ABNORMAL LOW (ref 3.2–6.5)
Beta Globulin: 5.6 % (ref 4.7–7.2)
Gamma Globulin: 21.9 % — ABNORMAL HIGH (ref 11.1–18.8)
M-Spike, %: 1.22 g/dL
Total Protein ELP: 7.1 g/dL (ref 6.0–8.3)

## 2011-03-19 LAB — VDRL, CSF: VDRL Quant, CSF: NONREACTIVE
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
## 2011-03-20 ENCOUNTER — Ambulatory Visit (HOSPITAL_COMMUNITY): Payer: Medicaid Other

## 2011-03-20 ENCOUNTER — Inpatient Hospital Stay (HOSPITAL_COMMUNITY)
Admission: AD | Admit: 2011-03-20 | Discharge: 2011-03-21 | Disposition: A | Discharge status: Home / Self Care | Payer: Medicaid Other | Source: Other Acute Inpatient Hospital | Attending: Cardiovascular Disease | Admitting: Cardiovascular Disease

## 2011-03-20 LAB — UIFE/LIGHT CHAINS/TP QN, 24-HR UR
Alpha 1, Urine: DETECTED — AB
Alpha 2, Urine: DETECTED — AB
Beta, Urine: DETECTED — AB
Free Lt Chn Excr Rate: 12.05 mg/d
Total Protein, Urine-Ur/day: 34 mg/d (ref 10–140)

## 2011-03-20 MED ORDER — TECHNETIUM TC 99M TETROFOSMIN IV KIT
10.0000 | PACK | Freq: Once | INTRAVENOUS | Status: AC | PRN
  Administered 2011-03-20: 10 via INTRAVENOUS

## 2011-03-20 MED ORDER — TECHNETIUM TC 99M TETROFOSMIN IV KIT
30.0000 | PACK | Freq: Once | INTRAVENOUS | Status: AC | PRN
  Administered 2011-03-20: 30 via INTRAVENOUS

## 2011-03-21 LAB — GLUCOSE, CAPILLARY

## 2011-03-21 LAB — APTT: aPTT: 28 seconds (ref 24–37)

## 2011-03-21 LAB — PROTIME-INR
INR: 1.03 (ref 0.00–1.49)
Prothrombin Time: 13.7 seconds (ref 11.6–15.2)

## 2011-03-21 LAB — IGG, IGA, IGM
IgG (Immunoglobin G), Serum: 160 mg/dL — ABNORMAL LOW (ref 690–1700)
IgM, Serum: 25 mg/dL — ABNORMAL LOW (ref 52–322)

## 2011-03-23 NOTE — Discharge Summary (Signed)
[name], LIPS NO.:  [id number]  MEDICAL RECORD NO.:  [id number]  LOCATION:  4703                         FACILITY:  [facility]  PHYSICIAN:  Ricki Rodriguez, M.D.  DATE OF BIRTH:  May 15, 1956  DATE OF ADMISSION:  03/20/2011 DATE OF DISCHARGE:  03/21/2011                              DISCHARGE SUMMARY   The patient was admitted by [facility]. Secondary admission to [facility] on March 20, 2011.  At that time, the patient admitted by Dr. Orpah Cobb.  FINAL DIAGNOSES: 1. Chest pain. 2. Hypertension. 3. Chronic pain syndrome. 4. Transient ischemic attacks. 5. Multiple sclerosis. 6. Anxiety and depression.  DISCHARGE MEDICATIONS: 1. Flexeril 5 mg one twice daily. 2. Neurontin 100 mg 3 times daily. 3. Vitamin D over-the-counter daily. 4. Vitamin B12 over-the-counter daily. 5. Vitamin E over-the-counter daily.  DISCHARGE DIET:  Low-sodium, heart-healthy diet.  DISCHARGE ACTIVITY:  The patient is to increase activity gradually after 2 days of sedentary lifestyle.  WOUND CARE INSTRUCTIONS:  The patient to notify right groin pain, swelling, or discharge.  CONDITION ON DISCHARGE:  Improved.  Followup by Dr. Orpah Cobb in 5 days.  The patient is to call [phone] for appointment.  HISTORY:  This 55 year old black female with multiple sclerosis, had severe headache going on for several months associated with chest pain that occurs with and without activity along with some heartburn.  PHYSICAL EXAMINATION:  GENERAL:  The patient is alert and oriented x3 female in no acute distress. VITAL SIGNS:  Blood pressure 135/88, pulse 76, respirations 20, temperature 98.1, and oxygen saturation 99% on room air. HEENT:  The patient is normocephalic, atraumatic with eyes brown. Conjunctivae pink.  Sclerae nonicteric. NECK:  Supple. LUNGS:  Clear to auscultation bilaterally with some use of accessory muscles. HEART:   Normal S1 and S2 without any murmur, gallop, or rub. ABDOMEN:  Soft, positive bowel sounds, nondistended, nontender, no organomegaly NEUROLOGICALLY:  Cranial nerves are grossly intact and moves all four extremities. EXTREMITIES:  No clubbing, cyanosis, or edema. SKIN:  No rash.  LABORATORY DATA: 1. Normal hemoglobin, hematocrit, WBC count, platelet count.  Normal     urinalysis.  Normal electrolytes, BUN, and creatinine. Normal liver     functions test.  Normal CK-MB, troponin-I.  Normal thyroid     stimulating hormone level.  Urine drug screen positive for     tetrahydrocannabinol.  ESR normal.  C-reactive protein normal.  CSF     nonreactive.  Protein electrophoresis essentially unremarkable, low     levels of IgA, IgG and IgM. 2. EKG, low atrial rhythm with possible inferolateral ischemia.     Nuclear stress test showed anterior and inferior apical area     ischemia. 3. Chest x-ray was without any acute cardiopulmonary disease. 4. Cardiac Catheterization: Near normal coronary arteries.  HOSPITAL COURSE:  The patient was admitted to Telemetry Floor at [facility].  She had workup done for multiple myeloma.  She was given medications with her chest pain and cardiac enzymes were cycled. Cardiology consult of Dr. Algie Coffer, was obtained.  She underwent nuclear stress test that showed possible reversible ischemia.  This was  then followed by cardiac catheterization that showed near normal coronaries. The patient's condition remaining stable.  She was discharged home with followup by me in 4-5 days.     Ricki Rodriguez, M.D.     ASK/MEDQ  D:  03/21/2011  T:  03/21/2011  Job:  [id number]  Electronically Signed by Orpah Cobb M.D. on 03/23/2011 09:01:47 PM

## 2011-03-27 LAB — OLIGOCLONAL BANDS, CSF + SERM

## 2011-04-16 LAB — JO-1 ANTIBODY-IGG: Jo-1 Antibody, IgG: <0.2 AI (ref <1.0)

## 2011-04-16 LAB — COMPREHENSIVE METABOLIC PANEL
AST: 24
Albumin: 4.5
BUN: 14
Chloride: 106
Creatinine, Ser: 0.91
GFR calc Af Amer: >60
Potassium: 3.9
Total Bilirubin: 1
Total Protein: 7.8

## 2011-04-16 LAB — CBC
HCT: 34.8 — ABNORMAL LOW
HCT: 37.6
Hemoglobin: 13.1
MCHC: 34.9
MCV: 91.6
Platelets: 251
Platelets: 250
RBC: 3.74 — ABNORMAL LOW
RBC: 4.11
RDW: 11.9
WBC: 5.1
WBC: 3.4 — ABNORMAL LOW

## 2011-04-16 LAB — CSF CULTURE W GRAM STAIN: Gram Stain: NONE SEEN

## 2011-04-16 LAB — HIV ANTIBODY (ROUTINE TESTING W REFLEX): HIV: NONREACTIVE

## 2011-04-16 LAB — BASIC METABOLIC PANEL
BUN: 11
BUN: 17
CO2: 30
Chloride: 105
GFR calc Af Amer: >60
GFR calc non Af Amer: >60
Potassium: 4.1
Potassium: 4.7
Sodium: 137

## 2011-04-16 LAB — EXTRACTABLE NUCLEAR ANTIGEN ANTIBODY: SSB (La) (ENA) Antibody, IgG: <0.2 AI (ref <1.0)

## 2011-04-16 LAB — SEDIMENTATION RATE: Sed Rate: 9

## 2011-04-16 LAB — URINALYSIS, ROUTINE W REFLEX MICROSCOPIC
Glucose, UA: NEGATIVE
Nitrite: NEGATIVE
Specific Gravity, Urine: 1.013
pH: 5.5

## 2011-04-16 LAB — ANGIOTENSIN CONVERTING ENZYME: Angiotensin-Converting Enzyme: 10 U/L (ref 9–67)

## 2011-04-16 LAB — IMMUNOFIXATION ELECTROPHORESIS
IgM, Serum: 41 — ABNORMAL LOW
Total Protein ELP: 7.8

## 2011-04-16 LAB — RPR: RPR Ser Ql: NONREACTIVE

## 2011-04-16 LAB — VITAMIN B12: Vitamin B-12: 616 (ref 211–911)

## 2011-04-16 LAB — DIFFERENTIAL
Eosinophils Absolute: 0.1
Eosinophils Relative: 2
Lymphs Abs: 1.8
Monocytes Relative: 7

## 2011-04-16 LAB — CRYPTOCOCCAL ANTIGEN, CSF

## 2011-04-16 LAB — VDRL, CSF: VDRL Quant, CSF: NONREACTIVE

## 2011-04-16 LAB — ANA: Anti Nuclear Antibody(ANA): NEGATIVE

## 2011-04-16 LAB — PROTIME-INR: INR: 1

## 2011-04-16 LAB — B. BURGDORFI ANTIBODIES: B burgdorferi Ab IgG+IgM: 0.12

## 2011-04-16 LAB — C-REACTIVE PROTEIN: CRP: 0.1 — ABNORMAL LOW (ref <0.6)

## 2011-05-01 LAB — BASIC METABOLIC PANEL
CO2: 26
Glucose, Bld: 67 — ABNORMAL LOW
Potassium: 3.8
Sodium: 136

## 2011-05-01 LAB — URINE CULTURE

## 2011-05-01 LAB — URINE MICROSCOPIC-ADD ON

## 2011-05-01 LAB — URINALYSIS, ROUTINE W REFLEX MICROSCOPIC
Glucose, UA: NEGATIVE
pH: 6

## 2011-12-22 ENCOUNTER — Ambulatory Visit
Admission: RE | Admit: 2011-12-22 | Discharge: 2011-12-22 | Disposition: A | Discharge status: Home / Self Care | Payer: Medicaid Other | Source: Ambulatory Visit | Attending: Cardiovascular Disease | Admitting: Cardiovascular Disease

## 2011-12-22 ENCOUNTER — Other Ambulatory Visit: Payer: Self-pay | Admitting: Cardiovascular Disease

## 2011-12-22 DIAGNOSIS — M549 Dorsalgia, unspecified: Secondary | ICD-10-CM

## 2012-11-04 ENCOUNTER — Encounter (HOSPITAL_COMMUNITY): Payer: Self-pay | Admitting: *Deleted

## 2012-11-04 ENCOUNTER — Emergency Department (HOSPITAL_COMMUNITY): Payer: Medicaid Other

## 2012-11-04 ENCOUNTER — Inpatient Hospital Stay (HOSPITAL_COMMUNITY)
Admission: EM | Admit: 2012-11-04 | Discharge: 2012-11-05 | DRG: 313 | Disposition: A | Discharge status: Home / Self Care | Payer: Medicaid Other | Attending: Cardiovascular Disease | Admitting: Cardiovascular Disease

## 2012-11-04 DIAGNOSIS — I10 Essential (primary) hypertension: Secondary | ICD-10-CM | POA: Diagnosis present

## 2012-11-04 DIAGNOSIS — I200 Unstable angina: Secondary | ICD-10-CM

## 2012-11-04 DIAGNOSIS — IMO0001 Reserved for inherently not codable concepts without codable children: Secondary | ICD-10-CM | POA: Diagnosis present

## 2012-11-04 DIAGNOSIS — F411 Generalized anxiety disorder: Secondary | ICD-10-CM | POA: Diagnosis present

## 2012-11-04 DIAGNOSIS — R079 Chest pain, unspecified: Principal | ICD-10-CM | POA: Diagnosis present

## 2012-11-04 DIAGNOSIS — Z87891 Personal history of nicotine dependence: Secondary | ICD-10-CM

## 2012-11-04 DIAGNOSIS — Z7982 Long term (current) use of aspirin: Secondary | ICD-10-CM

## 2012-11-04 DIAGNOSIS — G35 Multiple sclerosis: Secondary | ICD-10-CM | POA: Diagnosis present

## 2012-11-04 HISTORY — DX: Essential (primary) hypertension: I10

## 2012-11-04 LAB — BASIC METABOLIC PANEL
BUN: 13 mg/dL (ref 6–23)
CO2: 26 mEq/L (ref 19–32)
GFR calc non Af Amer: 77 mL/min — ABNORMAL LOW (ref >90)
Glucose, Bld: 107 mg/dL — ABNORMAL HIGH (ref 70–99)
Potassium: 4.5 mEq/L (ref 3.5–5.1)

## 2012-11-04 LAB — CBC
HCT: 31.8 % — ABNORMAL LOW (ref 36.0–46.0)
Hemoglobin: 11.3 g/dL — ABNORMAL LOW (ref 12.0–15.0)
MCH: 31 pg (ref 26.0–34.0)
MCHC: 35.5 g/dL (ref 30.0–36.0)

## 2012-11-04 LAB — TROPONIN I
Troponin I: <0.3 ng/mL (ref <0.30)
Troponin I: <0.3 ng/mL (ref <0.30)

## 2012-11-04 LAB — POCT I-STAT TROPONIN I

## 2012-11-04 MED ORDER — MORPHINE SULFATE 4 MG/ML IJ SOLN
4.0000 mg | Freq: Once | INTRAMUSCULAR | Status: AC
  Administered 2012-11-04: 4 mg via INTRAVENOUS
  Filled 2012-11-04: qty 1

## 2012-11-04 MED ORDER — SODIUM CHLORIDE 0.9 % IJ SOLN
3.0000 mL | INTRAMUSCULAR | Status: DC | PRN
  Administered 2012-11-05: 09:00:00 via INTRAVENOUS

## 2012-11-04 MED ORDER — GABAPENTIN 300 MG PO CAPS
300.0000 mg | ORAL_CAPSULE | Freq: Three times a day (TID) | ORAL | Status: DC
  Administered 2012-11-04 – 2012-11-05 (×5): 300 mg via ORAL
  Filled 2012-11-04 (×6): qty 1

## 2012-11-04 MED ORDER — HEPARIN (PORCINE) IN NACL 100-0.45 UNIT/ML-% IJ SOLN
950.0000 [IU]/h | INTRAMUSCULAR | Status: DC
  Administered 2012-11-04: 950 [IU]/h via INTRAVENOUS
  Filled 2012-11-04 (×3): qty 250

## 2012-11-04 MED ORDER — ASPIRIN 300 MG RE SUPP
300.0000 mg | RECTAL | Status: AC
  Filled 2012-11-04: qty 1

## 2012-11-04 MED ORDER — ALPRAZOLAM 0.25 MG PO TABS
0.2500 mg | ORAL_TABLET | Freq: Two times a day (BID) | ORAL | Status: DC | PRN
  Administered 2012-11-04: 0.25 mg via ORAL
  Filled 2012-11-04: qty 1

## 2012-11-04 MED ORDER — ASPIRIN EC 81 MG PO TBEC: 81.0000 mg | DELAYED_RELEASE_TABLET | Freq: Every day | ORAL | Status: DC

## 2012-11-04 MED ORDER — OXYCODONE-ACETAMINOPHEN 5-325 MG/5ML PO SOLN: 5.0000 mL | ORAL | Status: AC

## 2012-11-04 MED ORDER — ACETAMINOPHEN 325 MG PO TABS
650.0000 mg | ORAL_TABLET | ORAL | Status: DC | PRN
  Administered 2012-11-04 (×2): 650 mg via ORAL
  Filled 2012-11-04 (×3): qty 2

## 2012-11-04 MED ORDER — ASPIRIN EC 81 MG PO TBEC
81.0000 mg | DELAYED_RELEASE_TABLET | Freq: Every day | ORAL | Status: DC
  Filled 2012-11-04: qty 1

## 2012-11-04 MED ORDER — NITROGLYCERIN 0.4 MG SL SUBL: 0.4000 mg | SUBLINGUAL_TABLET | SUBLINGUAL | Status: DC | PRN

## 2012-11-04 MED ORDER — METOPROLOL TARTRATE 12.5 MG HALF TABLET
12.5000 mg | ORAL_TABLET | Freq: Two times a day (BID) | ORAL | Status: DC
  Filled 2012-11-04 (×3): qty 1

## 2012-11-04 MED ORDER — NITROGLYCERIN IN D5W 200-5 MCG/ML-% IV SOLN
10.0000 ug/min | INTRAVENOUS | Status: DC
  Administered 2012-11-04: 10 ug/min via INTRAVENOUS
  Filled 2012-11-04: qty 250

## 2012-11-04 MED ORDER — ASPIRIN EC 81 MG PO TBEC
81.0000 mg | DELAYED_RELEASE_TABLET | Freq: Every day | ORAL | Status: DC
  Administered 2012-11-05: 81 mg via ORAL
  Filled 2012-11-04: qty 1

## 2012-11-04 MED ORDER — ATORVASTATIN CALCIUM 40 MG PO TABS
40.0000 mg | ORAL_TABLET | Freq: Every day | ORAL | Status: DC
  Administered 2012-11-04 – 2012-11-05 (×2): 40 mg via ORAL
  Filled 2012-11-04 (×3): qty 1

## 2012-11-04 MED ORDER — ASPIRIN 81 MG PO CHEW
324.0000 mg | CHEWABLE_TABLET | ORAL | Status: AC
  Administered 2012-11-04: 324 mg via ORAL
  Filled 2012-11-04: qty 4

## 2012-11-04 MED ORDER — DOXEPIN HCL 50 MG PO CAPS
50.0000 mg | ORAL_CAPSULE | Freq: Every day | ORAL | Status: DC
  Administered 2012-11-04: 50 mg via ORAL
  Filled 2012-11-04 (×3): qty 1

## 2012-11-04 MED ORDER — HEPARIN BOLUS VIA INFUSION
3000.0000 [IU] | Freq: Once | INTRAVENOUS | Status: AC
  Administered 2012-11-04: 3000 [IU] via INTRAVENOUS

## 2012-11-04 MED ORDER — ONDANSETRON HCL 4 MG/2ML IJ SOLN
4.0000 mg | Freq: Four times a day (QID) | INTRAMUSCULAR | Status: DC | PRN
  Filled 2012-11-04: qty 2

## 2012-11-04 MED ORDER — GABAPENTIN 300 MG PO CAPS
300.0000 mg | ORAL_CAPSULE | Freq: Three times a day (TID) | ORAL | Status: DC
  Filled 2012-11-04 (×3): qty 1

## 2012-11-04 MED ORDER — SODIUM CHLORIDE 0.9 % IJ SOLN
3.0000 mL | Freq: Two times a day (BID) | INTRAMUSCULAR | Status: DC
  Administered 2012-11-04: 3 mL via INTRAVENOUS

## 2012-11-04 MED ORDER — SODIUM CHLORIDE 0.9 % IV SOLN: 250.0000 mL | INTRAVENOUS | Status: DC | PRN

## 2012-11-04 MED ORDER — LISINOPRIL 20 MG PO TABS
20.0000 mg | ORAL_TABLET | Freq: Every day | ORAL | Status: DC
  Filled 2012-11-04: qty 1

## 2012-11-04 MED ORDER — LISINOPRIL 20 MG PO TABS
20.0000 mg | ORAL_TABLET | Freq: Every day | ORAL | Status: DC
  Administered 2012-11-04 – 2012-11-05 (×2): 20 mg via ORAL
  Filled 2012-11-04 (×2): qty 1

## 2012-11-04 MED ORDER — METOPROLOL TARTRATE 12.5 MG HALF TABLET
12.5000 mg | ORAL_TABLET | Freq: Two times a day (BID) | ORAL | Status: DC
  Administered 2012-11-04 – 2012-11-05 (×3): 12.5 mg via ORAL
  Filled 2012-11-04 (×4): qty 1

## 2012-11-04 NOTE — Progress Notes (Signed)
ANTICOAGULATION CONSULT NOTE - Follow Up Consult  Pharmacy Consult for heparin Indication: USAP  Labs:  Recent Labs  11/04/12 0548 11/04/12 1158 11/04/12 1501 11/04/12 1750 11/04/12 2157  HGB 11.3*  --   --   --   --   HCT 31.8*  --   --   --   --   PLT 214  --   --   --   --   HEPARINUNFRC  --   --  0.48  --  0.39  CREATININE 0.82  --   --   --   --   TROPONINI  --  <0.30  --  <0.30  --     Assessment/Plan:  57yo female remains therapeutic on heparin for USAP.  Will continue gtt at current rate and continue to monitor levels.  Vernard Gambles, PharmD, BCPS  11/04/2012,10:52 PM

## 2012-11-04 NOTE — ED Notes (Signed)
Pt arrived via EMS c/o CP with dyspnea and right side body radiation. Pain originally 10 out of 10 on 0-10pain rating scale. EMS administered Nitro SL x 2. First dose at 0451, and second dose at 0513. Ems administered 4 mg Zofran at 0512. 20 g IV lt forearm.

## 2012-11-04 NOTE — ED Notes (Signed)
PT refuses all meds as ordered per admitting MD. Pt is clutching chest rolling back and forth in bed with a reported 8/10 CP.

## 2012-11-04 NOTE — Progress Notes (Signed)
ANTICOAGULATION CONSULT NOTE - Initial Consult  Pharmacy Consult for heparin Indication: USAP  Allergies  Allergen Reactions  . Codeine Itching    Tylenol with codeine caused an itching reaction    Patient Measurements:   Heparin Dosing Weight: 62 kg  Vital Signs: Temp: 98.6 F (37 C) (04/24 0530) Temp src: Oral (04/24 0530) BP: 151/88 mmHg (04/24 0530) Pulse Rate: 71 (04/24 0530)  Labs:  Recent Labs  11/04/12 0548  HGB 11.3*  HCT 31.8*  PLT 214  CREATININE 0.82    CrCl is unknown because there is no height on file for the current visit.   Medical History: No past medical history on file.  Medications:   (Not in a hospital admission)  Assessment: 57 yo lady to start heparin for CP/USAP.  Her baseline Hg is 11.3, PTLC 214 Goal of Therapy:  Heparin level 0.3-0.7 units/ml Monitor platelets by anticoagulation protocol: Yes   Plan:  Heparin bolus 3000 units and drip at 950 units/hr Check heparin level 6 hours after start.  Daily heparin level and CBC while on heparin. Monitor for s&s bleeding.   Shekela Goodridge Poteet 11/04/2012,7:10 AM

## 2012-11-04 NOTE — Progress Notes (Signed)
ANTICOAGULATION CONSULT NOTE - Initial Consult  Pharmacy Consult for heparin Indication: USAP  Allergies  Allergen Reactions  . Codeine Itching    Tylenol with codeine caused an itching reaction    Patient Measurements: Height: 5\' 4"  (162.6 cm) Weight: 182 lb 15.7 oz (83 kg) IBW/kg (Calculated) : 54.7 Heparin Dosing Weight: 62 kg  Vital Signs: Temp: 98 F (36.7 C) (04/24 1145) Temp src: Oral (04/24 1145) BP: 111/75 mmHg (04/24 1400) Pulse Rate: 66 (04/24 1200)  Labs:  Recent Labs  11/04/12 0548 11/04/12 1158 11/04/12 1501  HGB 11.3*  --   --   HCT 31.8*  --   --   PLT 214  --   --   HEPARINUNFRC  --   --  0.48  CREATININE 0.82  --   --   TROPONINI  --  <0.30  --     Estimated Creatinine Clearance: 77 ml/min (by C-G formula based on Cr of 0.82).   Medical History: Past Medical History  Diagnosis Date  . Hypertension     Medications:  Prescriptions prior to admission  Medication Sig Dispense Refill  . aspirin 325 MG tablet Take 325 mg by mouth daily.      . diclofenac (VOLTAREN) 75 MG EC tablet Take 75 mg by mouth 2 (two) times daily. Back pain      . doxepin (SINEQUAN) 50 MG capsule Take 50 mg by mouth at bedtime.      . gabapentin (NEURONTIN) 300 MG capsule Take 300 mg by mouth 3 (three) times daily. Usually only takes twice daily      . lisinopril (PRINIVIL,ZESTRIL) 20 MG tablet Take 20 mg by mouth daily.        Assessment: 57 yo lady to start heparin for CP/USAP.  Heparin came back therapeutic. No current issue.  Goal of Therapy:  Heparin level 0.3-0.7 units/ml Monitor platelets by anticoagulation protocol: Yes   Plan:   Cont heparin at 950 units/hr F/u with 6 hr confirm level

## 2012-11-04 NOTE — ED Provider Notes (Signed)
Medical screening examination/treatment/procedure(s) were performed by non-physician practitioner and as supervising physician I was immediately available for consultation/collaboration.  Olivia Mackie, MD 11/04/12 1755

## 2012-11-04 NOTE — ED Notes (Signed)
Pt self administered 325 ASA prior to EMS arrival

## 2012-11-04 NOTE — ED Provider Notes (Signed)
History     CSN: [id number]  Arrival date & time 11/04/12  4098   First MD Initiated Contact with Patient 11/04/12 [phone]      Chief Complaint  Patient presents with  . Chest Pain    (Consider location/radiation/quality/duration/timing/severity/associated sxs/prior treatment) HPI Comments: 56 year old female with a past medical history of TIA presents to the emergency department complaining of gradual onset midsternal chest pain beginning 1 day ago. Patient states last night while she was at home the chest pain began, described as a tight pressure feeling rated 10 out of 10, radiating to her right arm causing numbness and tingling. Chest pain lasted throughout the night and called EMS earlier today when she felt as if she could not catch her breath and was given 2 sublingual nitroglycerin which decreased her pain to a 4-5/10. Arm pain, numbness and tingling have since subsided. Admits to associated nausea without vomiting, Zofran relieved her nausea. She takes 325 mg aspirin daily which she took earlier today. Denies diaphoresis. No Hx of MI. Brother passed away of a "heart problem" at the age of 58. Non smoker. No recent surgeries.  Patient is a 57 y.o. female presenting with chest pain. The history is provided by the patient.  Chest Pain Associated symptoms: nausea, numbness and shortness of breath   Associated symptoms: no abdominal pain, no back pain, no diaphoresis, no dizziness, no fever, no headache, not vomiting and no weakness     No past medical history on file.  No past surgical history on file.  No family history on file.  History  Substance Use Topics  . Smoking status: Not on file  . Smokeless tobacco: Not on file  . Alcohol Use: Not on file    OB History   No data available      Review of Systems  Constitutional: Negative for fever, chills and diaphoresis.  Eyes: Negative for visual disturbance.  Respiratory: Positive for shortness of breath. Negative for  wheezing.   Cardiovascular: Positive for chest pain.  Gastrointestinal: Positive for nausea. Negative for vomiting and abdominal pain.  Musculoskeletal: Negative for back pain.  Skin: Negative for color change and pallor.  Neurological: Positive for numbness. Negative for dizziness, weakness, light-headedness and headaches.  All other systems reviewed and are negative.    Allergies  Review of patient's allergies indicates not on file.  Home Medications  No current outpatient prescriptions on file.  BP 151/88  Pulse 71  Temp(Src) 98.6 F (37 C) (Oral)  Resp 20  SpO2 100%  Physical Exam  Nursing note and vitals reviewed. Constitutional: She is oriented to person, place, and time. She appears well-developed and well-nourished. No distress.  HENT:  Head: Normocephalic and atraumatic.  Mouth/Throat: Oropharynx is clear and moist.  Eyes: Conjunctivae and EOM are normal. Pupils are equal, round, and reactive to light.  Neck: Normal range of motion. Neck supple. No JVD present.  Cardiovascular: Normal rate, regular rhythm, normal heart sounds and intact distal pulses.   No extremity edema.  Pulmonary/Chest: Effort normal and breath sounds normal. No respiratory distress. She has no wheezes. She has no rales. She exhibits no tenderness.  Abdominal: Soft. Bowel sounds are normal. There is no tenderness.  Musculoskeletal: Normal range of motion. She exhibits no edema.  Neurological: She is alert and oriented to person, place, and time. She has normal strength. No cranial nerve deficit or sensory deficit.  Skin: Skin is warm and dry. She is not diaphoretic. No pallor.  Psychiatric: She has  a normal mood and affect. Her behavior is normal.    ED Course  Procedures (including critical care time)  Labs Reviewed  CBC - Abnormal; Notable for the following:    WBC 3.8 (*)    RBC 3.64 (*)    Hemoglobin 11.3 (*)    HCT 31.8 (*)    All other components within normal limits  BASIC  METABOLIC PANEL - Abnormal; Notable for the following:    Glucose, Bld 107 (*)    GFR calc non Af Amer 77 (*)    GFR calc Af Amer 89 (*)    All other components within normal limits  PRO B NATRIURETIC PEPTIDE - Abnormal; Notable for the following:    Pro B Natriuretic peptide (BNP) 158.3 (*)    All other components within normal limits  POCT I-STAT TROPONIN I    Date: 11/04/2012  Rate: 70  Rhythm: normal sinus rhythm  QRS Axis: left  Intervals: normal  ST/T Wave abnormalities: nonspecific T wave changes t-wave inversions inferior leads, no change from 03/2011  Conduction Disutrbances:none  Narrative Interpretation: no stemi  Old EKG Reviewed: unchanged    Dg Chest Port 1 View  11/04/2012  *RADIOLOGY REPORT*  Clinical Data: Chest pain  PORTABLE CHEST - 1 VIEW  Comparison: 03/15/2011  Findings: Heart size upper normal.  Mediastinal contours otherwise within normal range. Hypoaeration with hemidiaphragm elevation, interstitial crowding, and mild bibasilar opacities.  No pleural effusion or pneumothorax.  Multilevel degenerative change.  IMPRESSION: Heart size upper normal.  Hypoaeration with interstitial crowding and mild bibasilar atelectasis.   Original Report Authenticated By: Jearld Lesch, M.D.      1. Unstable angina       MDM  Unstable angina- initially pain improved from 10/10 to 4-5/10 with SL nitro. Gave morphine, got better initially, pain returned to 7/10. Will give more morphine. EKG without any changes. Troponin negative. Labs normal. Will start nitro drip and consult cardiology for admission. Case discussed with Dr. Norlene Campbell who agrees with plan of care. 7:07 AM Spoke with Dr. Sharyn Lull who will admit patient. Heparin started per Dr. Sharyn Lull.        Trevor Mace, PA-C 11/04/12 [phone]

## 2012-11-04 NOTE — H&P (Signed)
[name] is an 57 y.o. female.   Chief Complaint: Chest ppain HPI: 57 year old female with complain of gradual onset midsternal chest pain beginning 1 day ago. Patient describes chest pain as a tight pressure feeling rated 10 out of 10, radiating to her right arm causing numbness and tingling. Chest pain lasted throughout the night and she called EMS earlier today when she felt as if she could not catch her breath. 2 sublingual nitroglycerin decreased her pain to a 4-5/10. Arm pain, numbness and tingling have since subsided. Admits to associated nausea without vomiting, Zofran relieved her nausea. She takes 325 mg aspirin daily which she took earlier today. Denies diaphoresis. No Hx of MI. Brother passed away of a "heart problem" at the age of 82. Patient is non smoker and has no recent surgeries.   Past Medical History  Diagnosis Date  . Hypertension       History reviewed. No pertinent past surgical history.  History reviewed. No pertinent family history. Social History:  reports that she has quit smoking. She does not have any smokeless tobacco history on file. She reports that she does not drink alcohol. Her drug history is not on file.  Allergies:  Allergies  Allergen Reactions  . Codeine Itching    Tylenol with codeine caused an itching reaction     (Not in a hospital admission)  Results for orders placed during the hospital encounter of 11/04/12 (from the past 48 hour(s))  CBC     Status: Abnormal   Collection Time    11/04/12  5:48 AM      Result Value Range   WBC 3.8 (*) 4.0 - 10.5 K/uL   RBC 3.64 (*) 3.87 - 5.11 MIL/uL   Hemoglobin 11.3 (*) 12.0 - 15.0 g/dL   HCT 16.1 (*) 09.6 - 04.5 %   MCV 87.4  78.0 - 100.0 fL   MCH 31.0  26.0 - 34.0 pg   MCHC 35.5  30.0 - 36.0 g/dL   RDW 40.9  81.1 - 91.4 %   Platelets 214  150 - 400 K/uL  BASIC METABOLIC PANEL     Status: Abnormal   Collection Time    11/04/12  5:48 AM      Result Value Range   Sodium 139  135 - 145  mEq/L   Potassium 4.5  3.5 - 5.1 mEq/L   Chloride 107  96 - 112 mEq/L   CO2 26  19 - 32 mEq/L   Glucose, Bld 107 (*) 70 - 99 mg/dL   BUN 13  6 - 23 mg/dL   Creatinine, Ser 7.82  0.50 - 1.10 mg/dL   Calcium 8.9  8.4 - 95.6 mg/dL   GFR calc non Af Amer 77 (*) >90 mL/min   GFR calc Af Amer 89 (*) >90 mL/min   Comment:            The eGFR has been calculated     using the CKD EPI equation.     This calculation has not been     validated in all clinical     situations.     eGFR's persistently     <90 mL/min signify     possible Chronic Kidney Disease.  PRO B NATRIURETIC PEPTIDE     Status: Abnormal   Collection Time    11/04/12  5:54 AM      Result Value Range   Pro B Natriuretic peptide (BNP) 158.3 (*) 0 - 125 pg/mL  POCT  I-STAT TROPONIN I     Status: None   Collection Time    11/04/12  6:06 AM      Result Value Range   Troponin i, poc 0.01  0.00 - 0.08 ng/mL   Comment 3            Comment: Due to the release kinetics of cTnI,     a negative result within the first hours     of the onset of symptoms does not rule out     myocardial infarction with certainty.     If myocardial infarction is still suspected,     repeat the test at appropriate intervals.   Dg Chest Port 1 View  11/04/2012  *RADIOLOGY REPORT*  Clinical Data: Chest pain  PORTABLE CHEST - 1 VIEW  Comparison: 03/15/2011  Findings: Heart size upper normal.  Mediastinal contours otherwise within normal range. Hypoaeration with hemidiaphragm elevation, interstitial crowding, and mild bibasilar opacities.  No pleural effusion or pneumothorax.  Multilevel degenerative change.  IMPRESSION: Heart size upper normal.  Hypoaeration with interstitial crowding and mild bibasilar atelectasis.   Original Report Authenticated By: Jearld Lesch, M.D.     @ROS @ Constitutional: Negative for fever, chills and diaphoresis.  Eyes: Negative for visual disturbance.  Respiratory: Positive for shortness of breath. Negative for wheezing.   Cardiovascular: Positive for chest pain.  Gastrointestinal: Positive for nausea. Negative for vomiting and abdominal pain.  Musculoskeletal: Negative for back pain.  Skin: Negative for color change and pallor.  Neurological: Positive for numbness. Negative for dizziness, weakness, light-headedness and headaches.  Blood pressure 135/67, pulse 72, temperature 98.6 F (37 C), temperature source Oral, resp. rate 10, SpO2 98.00%.  PHYSICAL EXAMINATION: GENERAL: Averagely built and well nourished female in mild distress. HEENT: The patient is normocephalic, atraumatic with eyes brown. Conjunctivae pink. Sclerae nonicteric.  NECK: Supple.  LUNGS: Clear to auscultation bilaterally. HEART: Normal S1 and S2 without any murmur, gallop, or rub.  ABDOMEN: Soft, positive bowel sounds, nondistended, nontender, no organomegaly  NEUROLOGICALLY: Cranial nerves are grossly intact and moves all four extremities.  EXTREMITIES: No clubbing, cyanosis, or edema.  SKIN: No rash.  Assessment/Plan Chest pain Hypertension Fibromyalgia Multiple sclerosis Anxiety  Admit/r/p MI Nuclear stress test.  [name] S 11/04/2012, 9:22 AM

## 2012-11-04 NOTE — Care Management Note (Signed)
    Page 1 of 1   11/04/2012     12:01:23 PM   CARE MANAGEMENT NOTE 11/04/2012  Patient:  [name], [name]   Account Number:  [id number]  Date Initiated:  11/04/2012  Documentation initiated by:  Junius Creamer  Subjective/Objective Assessment:   adm w angina     Action/Plan:   lives w husband, pcp dr Cordelia Pen ryter-brown   Anticipated DC Date:     Anticipated DC Plan:        DC Planning Services  CM consult      Choice offered to / List presented to:             Status of service:   Medicare Important Message given?   (If response is "NO", the following Medicare IM given date fields will be blank) Date Medicare IM given:   Date Additional Medicare IM given:    Discharge Disposition:    Per UR Regulation:  Reviewed for med. necessity/level of care/duration of stay  If discussed at Long Length of Stay Meetings, dates discussed:    Comments:

## 2012-11-05 ENCOUNTER — Inpatient Hospital Stay (HOSPITAL_COMMUNITY): Payer: Medicaid Other

## 2012-11-05 LAB — BASIC METABOLIC PANEL WITH GFR
BUN: 14 mg/dL (ref 6–23)
CO2: 25 meq/L (ref 19–32)
Calcium: 8.8 mg/dL (ref 8.4–10.5)
Chloride: 105 meq/L (ref 96–112)
Creatinine, Ser: 0.94 mg/dL (ref 0.50–1.10)
GFR calc Af Amer: 75 mL/min — ABNORMAL LOW
GFR calc non Af Amer: 65 mL/min — ABNORMAL LOW
Glucose, Bld: 112 mg/dL — ABNORMAL HIGH (ref 70–99)
Potassium: 4.2 meq/L (ref 3.5–5.1)
Sodium: 137 meq/L (ref 135–145)

## 2012-11-05 LAB — CBC
HCT: 31.9 % — ABNORMAL LOW (ref 36.0–46.0)
Hemoglobin: 11.5 g/dL — ABNORMAL LOW (ref 12.0–15.0)
MCH: 31.6 pg (ref 26.0–34.0)
MCHC: 36.1 g/dL — ABNORMAL HIGH (ref 30.0–36.0)
MCV: 87.6 fL (ref 78.0–100.0)
Platelets: 214 K/uL (ref 150–400)
RBC: 3.64 MIL/uL — ABNORMAL LOW (ref 3.87–5.11)
RDW: 12.2 % (ref 11.5–15.5)
WBC: 4 K/uL (ref 4.0–10.5)

## 2012-11-05 LAB — LIPID PANEL
LDL Cholesterol: 85 mg/dL (ref 0–99)
Triglycerides: 138 mg/dL (ref <150)
VLDL: 28 mg/dL (ref 0–40)

## 2012-11-05 LAB — HEPARIN LEVEL (UNFRACTIONATED): Heparin Unfractionated: 0.37 [IU]/mL (ref 0.30–0.70)

## 2012-11-05 LAB — TROPONIN I: Troponin I: <0.3 ng/mL

## 2012-11-05 MED ORDER — METOPROLOL TARTRATE 25 MG PO TABS: 12.5000 mg | ORAL_TABLET | Freq: Two times a day (BID) | ORAL | Status: AC

## 2012-11-05 MED ORDER — REGADENOSON 0.4 MG/5ML IV SOLN
0.4000 mg | Freq: Once | INTRAVENOUS | Status: AC
  Administered 2012-11-05: 0.4 mg via INTRAVENOUS

## 2012-11-05 MED ORDER — TECHNETIUM TC 99M SESTAMIBI - CARDIOLITE
10.0000 | Freq: Once | INTRAVENOUS | Status: AC | PRN
  Administered 2012-11-05: 09:00:00 10 via INTRAVENOUS

## 2012-11-05 MED ORDER — DICLOFENAC SODIUM 75 MG PO TBEC
75.0000 mg | DELAYED_RELEASE_TABLET | Freq: Two times a day (BID) | ORAL | Status: DC
  Administered 2012-11-05: 75 mg via ORAL
  Filled 2012-11-05 (×2): qty 1

## 2012-11-05 MED ORDER — TECHNETIUM TC 99M SESTAMIBI - CARDIOLITE
30.0000 | Freq: Once | INTRAVENOUS | Status: AC | PRN
  Administered 2012-11-05: 12:00:00 30 via INTRAVENOUS

## 2012-11-05 NOTE — Discharge Summary (Signed)
Physician Discharge Summary  Patient ID: YARELI CARTHEN MRN: [id number] DOB/AGE: 08/19/1955 57 y.o.  Admit date: 11/04/2012 Discharge date: 11/05/2012  Admission Diagnoses: Chest pain  Hypertension  Fibromyalgia  Multiple sclerosis  Anxiety  Discharge Diagnoses:  Principle Problem: * Chest pain, probably non-cardiac*  Hypertension  Fibromyalgia  Multiple sclerosis  Anxiety  Discharged Condition: good  Hospital Course: 57 year old female with complain of gradual onset midsternal pressure type chest pain, 1 day before admission. SL NTG and analgesic helped. Cardiac enzymes followed by nuclear stress test were negative. Her last cardiac cath in 2012 showed minimal coronary artery disease. She was discharged home with aspirin, B-blocker, lisinopril and cholesterol lowering agent.  Consults: None  Significant Diagnostic Studies: labs: Near normal CBC, BMET and cardiac enzymes. Lexiscan cardiolyte study without reversible ischemia.  Treatments: analgesia: oxycodone and cardiac meds: lisinopril (Prinivil), metoprolol and Lipitor  Discharge Exam: Blood pressure 107/56, pulse 62, temperature 97.4 F (36.3 C), temperature source Oral, resp. rate 18, height 5\' 4"  (1.626 m), weight 83 kg (182 lb 15.7 oz), SpO2 100.00%. GENERAL: Averagely built and well nourished female in mild distress.  HEENT: The patient is normocephalic, atraumatic with eyes brown. Conjunctivae pink. Sclerae nonicteric.  NECK: Supple.  LUNGS: Clear to auscultation bilaterally.  HEART: Normal S1 and S2 without any murmur, gallop, or rub.  ABDOMEN: Soft, positive bowel sounds, nondistended, nontender, no organomegaly  NEUROLOGICALLY: Cranial nerves are grossly intact and moves all four extremities.  EXTREMITIES: No clubbing, cyanosis, or edema.  SKIN: No rash.   Disposition: 01-Home or Self Care     Medication List    TAKE these medications       aspirin 325 MG tablet  Take 325 mg by mouth daily.     diclofenac 75 MG EC tablet  Commonly known as:  VOLTAREN  Take 75 mg by mouth 2 (two) times daily. Back pain     doxepin 50 MG capsule  Commonly known as:  SINEQUAN  Take 50 mg by mouth at bedtime.     gabapentin 300 MG capsule  Commonly known as:  NEURONTIN  Take 300 mg by mouth 3 (three) times daily. Usually only takes twice daily     lisinopril 20 MG tablet  Commonly known as:  PRINIVIL,ZESTRIL  Take 20 mg by mouth daily.     metoprolol tartrate 25 MG tablet  Commonly known as:  LOPRESSOR  Take 0.5 tablets (12.5 mg total) by mouth 2 (two) times daily.           Follow-up Information   Follow up with Deloris Ping, MD. Schedule an appointment as soon as possible for a visit in 1 month.   Contact information:   [address] [place] [address] [phone]       Follow up with [facility], MD. Schedule an appointment as soon as possible for a visit in 1 month.   Contact information:   [address] [place] [address] [phone]       Signed: Ricki Rodriguez 11/05/2012, 4:34 PM

## 2012-11-05 NOTE — Progress Notes (Signed)
ANTICOAGULATION CONSULT NOTE - Initial Consult  Pharmacy Consult for heparin Indication: USAP  Allergies  Allergen Reactions  . Codeine Itching    Tylenol with codeine caused an itching reaction    Patient Measurements: Height: 5\' 4"  (162.6 cm) Weight: 182 lb 15.7 oz (83 kg) IBW/kg (Calculated) : 54.7 Heparin Dosing Weight: 62 kg  Vital Signs: Temp: 97.4 F (36.3 C) (04/25 0801) Temp src: Oral (04/25 0801) BP: 132/60 mmHg (04/25 0801) Pulse Rate: 62 (04/25 0400)  Labs:  Recent Labs  11/04/12 0548 11/04/12 1158 11/04/12 1501 11/04/12 1750 11/04/12 2157 11/04/12 2327 11/05/12 0400  HGB 11.3*  --   --   --   --   --  11.5*  HCT 31.8*  --   --   --   --   --  31.9*  PLT 214  --   --   --   --   --  214  LABPROT  --   --   --   --   --   --  13.9  INR  --   --   --   --   --   --  1.08  HEPARINUNFRC  --   --  0.48  --  0.39  --  0.37  CREATININE 0.82  --   --   --   --   --  0.94  TROPONINI  --  <0.30  --  <0.30  --  <0.30  --     Estimated Creatinine Clearance: 67.1 ml/min (by C-G formula based on Cr of 0.94).  Assessment: 57 yof on heparin for CP/USAP.  Heparin continues to be therapeutic. No current issues, cbc stable. For stress test this morning.  Goal of Therapy:  Heparin level 0.3-0.7 units/ml Monitor platelets by anticoagulation protocol: Yes   Plan:  Cont heparin at 950 units/hr Daily HL/CBC Follow results of stress test  Sheppard Coil PharmD., BCPS Clinical Pharmacist Pager [phone] 11/05/2012 9:32 AM

## 2012-11-05 NOTE — Progress Notes (Signed)
Pt went to nuclear med for a lexiscan with transporter.

## 2012-11-05 NOTE — Progress Notes (Signed)
Discharged information given. Verbalized understanding. Husband at bedside. No distress noted.

## 2013-03-30 IMAGING — RF DG FLUORO GUIDE NDL PLC/BX
1 series · 1 of 1 positions shown · non-contrast
Comparison: none

CLINICAL DATA: Headache.  Possible meningitis.

DIAGNOSTIC LUMBAR PUNCTURE UNDER FLUOROSCOPIC GUIDANCE
Fluoroscopy time:  1.4 minutes.
TECHNIQUE: Informed consent was obtained from the patient prior to
the procedure, including potential complications of headache,
allergy, and pain.   With the patient prone, the lower back was
prepped with Betadine.  1% Lidocaine was used for local anesthesia.
Lumbar puncture was performed at the L2-3 level using a 20 gauge
needle with return of clear CSF with an opening pressure of 12 cm
water.   12 ml of CSF were obtained for laboratory studies.  The
patient tolerated the procedure well and there were no apparent
complications.

[Series 1: run · 1 of 1 slices shown]
[im 1/1]
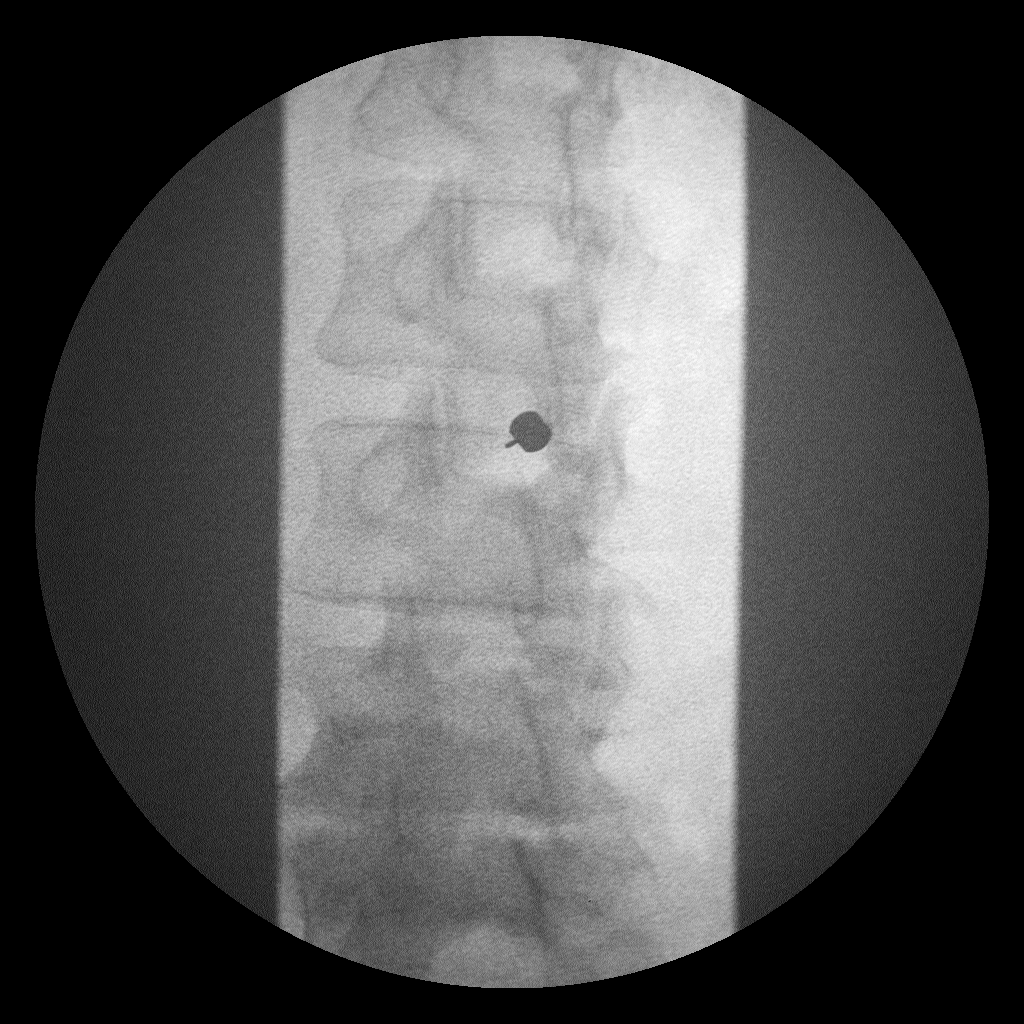

[1 of 1 positions shown; findings below may reference images not displayed]

IMPRESSION: Successful fluoroscopically guided lumbar puncture.

## 2014-02-01 ENCOUNTER — Other Ambulatory Visit: Payer: Self-pay | Admitting: Cardiology

## 2014-02-01 ENCOUNTER — Ambulatory Visit
Admission: RE | Admit: 2014-02-01 | Discharge: 2014-02-01 | Disposition: A | Discharge status: Home / Self Care | Payer: Medicaid Other | Source: Ambulatory Visit | Attending: Cardiology | Admitting: Cardiology

## 2014-02-01 DIAGNOSIS — R42 Dizziness and giddiness: Secondary | ICD-10-CM

## 2015-09-03 LAB — PROCEDURE REPORT - SCANNED: PAP SMEAR: ABNORMAL — AB

## 2016-04-08 ENCOUNTER — Encounter: Payer: Self-pay | Admitting: *Deleted

## 2016-07-14 DEATH — deceased
